# Patient Record
Sex: Female | Born: 1980 | Race: White | Hispanic: No | Marital: Married | Smoking: Never smoker
Health system: Southern US, Community
[De-identification: ages and names within clinical notes are randomized; demographics above are authoritative.]

## PROBLEM LIST (undated history)

## (undated) DIAGNOSIS — J45909 Unspecified asthma, uncomplicated: Secondary | ICD-10-CM

## (undated) DIAGNOSIS — E079 Disorder of thyroid, unspecified: Secondary | ICD-10-CM

## (undated) DIAGNOSIS — M357 Hypermobility syndrome: Secondary | ICD-10-CM

## (undated) DIAGNOSIS — F419 Anxiety disorder, unspecified: Secondary | ICD-10-CM

## (undated) DIAGNOSIS — Q796 Ehlers-Danlos syndrome, unspecified: Secondary | ICD-10-CM

## (undated) DIAGNOSIS — F32A Depression, unspecified: Secondary | ICD-10-CM

## (undated) DIAGNOSIS — N809 Endometriosis, unspecified: Secondary | ICD-10-CM

## (undated) DIAGNOSIS — K219 Gastro-esophageal reflux disease without esophagitis: Secondary | ICD-10-CM

## (undated) DIAGNOSIS — T7840XA Allergy, unspecified, initial encounter: Secondary | ICD-10-CM

## (undated) DIAGNOSIS — K589 Irritable bowel syndrome without diarrhea: Secondary | ICD-10-CM

## (undated) DIAGNOSIS — M797 Fibromyalgia: Secondary | ICD-10-CM

## (undated) HISTORY — DX: Hypermobility syndrome: M35.7

## (undated) HISTORY — DX: Gastro-esophageal reflux disease without esophagitis: K21.9

## (undated) HISTORY — DX: Ehlers-Danlos syndrome, unspecified: Q79.60

## (undated) HISTORY — DX: Disorder of thyroid, unspecified: E07.9

## (undated) HISTORY — DX: Irritable bowel syndrome, unspecified: K58.9

## (undated) HISTORY — DX: Allergy, unspecified, initial encounter: T78.40XA

## (undated) HISTORY — DX: Unspecified asthma, uncomplicated: J45.909

## (undated) HISTORY — DX: Anxiety disorder, unspecified: F41.9

## (undated) HISTORY — DX: Depression, unspecified: F32.A

---

## 2017-07-26 DIAGNOSIS — M797 Fibromyalgia: Secondary | ICD-10-CM | POA: Insufficient documentation

## 2017-07-27 DIAGNOSIS — E039 Hypothyroidism, unspecified: Secondary | ICD-10-CM | POA: Insufficient documentation

## 2017-07-27 DIAGNOSIS — D229 Melanocytic nevi, unspecified: Secondary | ICD-10-CM | POA: Insufficient documentation

## 2017-07-27 DIAGNOSIS — F419 Anxiety disorder, unspecified: Secondary | ICD-10-CM

## 2017-07-27 DIAGNOSIS — F329 Major depressive disorder, single episode, unspecified: Secondary | ICD-10-CM | POA: Insufficient documentation

## 2017-07-27 DIAGNOSIS — K589 Irritable bowel syndrome without diarrhea: Secondary | ICD-10-CM | POA: Insufficient documentation

## 2017-07-27 DIAGNOSIS — E038 Other specified hypothyroidism: Secondary | ICD-10-CM | POA: Insufficient documentation

## 2017-07-27 DIAGNOSIS — R000 Tachycardia, unspecified: Secondary | ICD-10-CM | POA: Insufficient documentation

## 2017-07-27 DIAGNOSIS — F32A Depression, unspecified: Secondary | ICD-10-CM | POA: Insufficient documentation

## 2017-08-16 ENCOUNTER — Other Ambulatory Visit: Payer: Self-pay | Admitting: Obstetrics and Gynecology

## 2017-08-16 DIAGNOSIS — R1032 Left lower quadrant pain: Secondary | ICD-10-CM

## 2017-08-23 ENCOUNTER — Other Ambulatory Visit: Payer: Self-pay

## 2017-08-24 ENCOUNTER — Ambulatory Visit
Admission: RE | Admit: 2017-08-24 | Discharge: 2017-08-24 | Disposition: A | Discharge status: Home / Self Care | Payer: BLUE CROSS/BLUE SHIELD | Source: Ambulatory Visit | Attending: Obstetrics and Gynecology | Admitting: Obstetrics and Gynecology

## 2017-08-24 DIAGNOSIS — R1032 Left lower quadrant pain: Secondary | ICD-10-CM

## 2017-09-05 ENCOUNTER — Other Ambulatory Visit: Payer: Self-pay | Admitting: Gastroenterology

## 2017-09-05 DIAGNOSIS — R1032 Left lower quadrant pain: Secondary | ICD-10-CM

## 2017-09-11 ENCOUNTER — Other Ambulatory Visit: Payer: BLUE CROSS/BLUE SHIELD

## 2017-10-04 ENCOUNTER — Emergency Department (HOSPITAL_COMMUNITY)
Admission: EM | Admit: 2017-10-04 | Discharge: 2017-10-04 | Disposition: A | Discharge status: Home / Self Care | Payer: BLUE CROSS/BLUE SHIELD | Attending: Emergency Medicine | Admitting: Emergency Medicine

## 2017-10-04 ENCOUNTER — Emergency Department (HOSPITAL_COMMUNITY): Payer: BLUE CROSS/BLUE SHIELD

## 2017-10-04 ENCOUNTER — Other Ambulatory Visit: Payer: Self-pay

## 2017-10-04 ENCOUNTER — Encounter (HOSPITAL_COMMUNITY): Payer: Self-pay | Admitting: Emergency Medicine

## 2017-10-04 DIAGNOSIS — Z79899 Other long term (current) drug therapy: Secondary | ICD-10-CM | POA: Diagnosis not present

## 2017-10-04 DIAGNOSIS — R002 Palpitations: Secondary | ICD-10-CM | POA: Insufficient documentation

## 2017-10-04 DIAGNOSIS — R008 Other abnormalities of heart beat: Secondary | ICD-10-CM | POA: Diagnosis present

## 2017-10-04 HISTORY — DX: Fibromyalgia: M79.7

## 2017-10-04 HISTORY — DX: Endometriosis, unspecified: N80.9

## 2017-10-04 LAB — CBC
HCT: 43 % (ref 36.0–46.0)
Hemoglobin: 14.2 g/dL (ref 12.0–15.0)
MCH: 30.8 pg (ref 26.0–34.0)
MCHC: 33 g/dL (ref 30.0–36.0)
MCV: 93.3 fL (ref 78.0–100.0)
Platelets: 270 10*3/uL (ref 150–400)
RBC: 4.61 MIL/uL (ref 3.87–5.11)
RDW: 12.6 % (ref 11.5–15.5)
WBC: 8.4 10*3/uL (ref 4.0–10.5)

## 2017-10-04 LAB — BASIC METABOLIC PANEL
Anion gap: 10 (ref 5–15)
BUN: 7 mg/dL (ref 6–20)
CHLORIDE: 106 mmol/L (ref 101–111)
CO2: 24 mmol/L (ref 22–32)
Calcium: 9.6 mg/dL (ref 8.9–10.3)
Creatinine, Ser: 0.79 mg/dL (ref 0.44–1.00)
GFR calc Af Amer: >60 mL/min (ref >60)
GFR calc non Af Amer: >60 mL/min (ref >60)
Glucose, Bld: 116 mg/dL — ABNORMAL HIGH (ref 65–99)
POTASSIUM: 3.7 mmol/L (ref 3.5–5.1)
SODIUM: 140 mmol/L (ref 135–145)

## 2017-10-04 LAB — I-STAT TROPONIN, ED: Troponin i, poc: 0 ng/mL (ref 0.00–0.08)

## 2017-10-04 LAB — I-STAT BETA HCG BLOOD, ED (MC, WL, AP ONLY)

## 2017-10-04 MED ORDER — SODIUM CHLORIDE 0.9 % IV BOLUS
1000.0000 mL | Freq: Once | INTRAVENOUS | Status: AC
  Administered 2017-10-04: 1000 mL via INTRAVENOUS

## 2017-10-04 NOTE — ED Provider Notes (Signed)
[facility] DEPT Provider Note   CSN: [id number] Arrival date & time: 10/04/17  1551     History   Chief Complaint Chief Complaint  Patient presents with  . Irregular Heart Beat    HPI Sharon Taylor is a 37 y.o. female.  HPI Patient presents with palpitations.  States that over the last month she has had tingling nausea lightheadedness.  She been seen by her primary care doctor for it.  She got an apple watch to monitor for atrial fibrillation and she states just to reassure her.  Has been worked up for several different autoimmune diseases.  Reportedly has fibromyalgia now.Patient had an episode today where she felt the same symptoms and her apple watch said atrial fibrillation.  She has had several different episodes where it will say inconclusive but this the first time to set atrial fibrillation.  She states she does not feel her heart going faster slow during the event. Past Medical History:  Diagnosis Date  . Endometriosis   . Fibromyalgia     There are no active problems to display for this patient.   History reviewed. No pertinent surgical history.   OB History   None      Home Medications    Prior to Admission medications   Medication Sig Start Date End Date Taking? Authorizing Provider  carboxymethylcellul-glycerin (OPTIVE) 0.5-0.9 % ophthalmic solution Place 1 drop into both eyes 2 (two) times daily.   Yes [provider]  cetirizine (ZYRTEC) 10 MG tablet Take 10 mg by mouth daily.   Yes [provider]  cholecalciferol (VITAMIN D) 1000 units tablet Take 2,000 Units by mouth daily.   Yes [provider]  NUVARING 0.12-0.015 MG/24HR vaginal ring Place 1 each vaginally every 28 (twenty-eight) days.  09/24/17  Yes [provider]    Family History No family history on file.  Social History Social History   Tobacco Use  . Smoking status: Not on file  Substance Use Topics  . Alcohol  use: Not on file  . Drug use: Not on file     Allergies   Patient has no known allergies.   Review of Systems Review of Systems  Constitutional: Negative for appetite change.  HENT: Negative for congestion.   Respiratory: Negative for shortness of breath.   Cardiovascular: Positive for palpitations.  Gastrointestinal: Negative for abdominal pain.  Genitourinary: Negative for flank pain.  Musculoskeletal: Positive for myalgias. Negative for back pain.  Skin: Negative for pallor.  Neurological: Positive for light-headedness.  Hematological: Positive for adenopathy.  Psychiatric/Behavioral: Negative for confusion.     Physical Exam Updated Vital Signs BP (!) 90/59 (BP Location: Right Arm)   Pulse (!) 59   Temp 98 F (36.7 C) (Oral)   Resp 16   SpO2 100%   Physical Exam  Constitutional: She appears well-developed.  HENT:  Head: Atraumatic.  Eyes: EOM are normal.  Neck: Neck supple.  Cardiovascular: Normal rate.  Pulmonary/Chest: Effort normal.  Abdominal: Soft. There is no tenderness.  Musculoskeletal: She exhibits no edema.  Neurological: She is alert.  Skin: Skin is warm. Capillary refill takes less than 2 seconds.     ED Treatments / Results  Labs (all labs ordered are listed, but only abnormal results are displayed) Labs Reviewed  BASIC METABOLIC PANEL - Abnormal; Notable for the following components:      Result Value   Glucose, Bld 116 (*)    All other components within normal limits  CBC  I-STAT TROPONIN, ED  I-STAT BETA HCG BLOOD, ED ([facility], [facility], [facility] ONLY)    EKG EKG Interpretation  Date/Time:  Wednesday Oct 04 2017 15:57:28 EDT Ventricular Rate:  82 PR Interval:    QRS Duration: 78 QT Interval:  334 QTC Calculation: 390 R Axis:   96 Text Interpretation:  Sinus rhythm Atrial premature complex Short PR interval Right atrial enlargement Borderline right axis deviation Repol abnrm suggests ischemia, inferior leads Confirmed by Davonna Belling  [phone]) on 10/04/2017 5:09:07 PM   Radiology Dg Chest 2 View  Result Date: 10/04/2017 CLINICAL DATA:  Chest pain, nausea, and lightheadedness. EXAM: CHEST - 2 VIEW COMPARISON:  None. FINDINGS: The heart size and mediastinal contours are within normal limits. Both lungs are clear. The visualized skeletal structures are unremarkable. IMPRESSION: No active cardiopulmonary disease. Electronically Signed   By: Titus Dubin M.D.   On: 10/04/2017 16:54    Procedures Procedures (including critical care time)  Medications Ordered in ED Medications  sodium chloride 0.9 % bolus 1,000 mL (1,000 mLs Intravenous New Bag/Given 10/04/17 1630)     Initial Impression / Assessment and Plan / ED Course  I have reviewed the triage vital signs and the nursing notes.  Pertinent labs & imaging results that were available during my care of the patient were reviewed by me and considered in my medical decision making (see chart for details).     Patient with episodes of palpitations and feeling her heartbeat differently.  Sinus rhythm here.  There are some nonspecific ST changes however on EKG.  Lab work reassuring.  Glucose slightly elevated.  Given cardiology for follow-up and may need a longer monitor.  While in triage getting her blood drawn she had what appears to be a vagal episode and her blood pressure drop.  Is since returned and she feels much better.  Discharge home.  Final Clinical Impressions(s) / ED Diagnoses   Final diagnoses:  Palpitations    ED Discharge Orders    None       Davonna Belling, MD 10/04/17 1739

## 2017-10-04 NOTE — ED Triage Notes (Addendum)
Pt complaint of extremity tingling, nausea, hot flashes, and lightheadedness worse over past week but present for a few months; "watch said I had atrial fibrillation."

## 2017-10-04 NOTE — ED Notes (Signed)
Unsuccessful IV attempt to right hand.

## 2017-10-12 ENCOUNTER — Ambulatory Visit: Payer: BLUE CROSS/BLUE SHIELD | Admitting: Cardiology

## 2017-10-12 ENCOUNTER — Encounter: Payer: Self-pay | Admitting: Cardiology

## 2017-10-12 VITALS — BP 118/70 | HR 68 | Ht 68.0 in | Wt 140.8 lb

## 2017-10-12 DIAGNOSIS — I499 Cardiac arrhythmia, unspecified: Secondary | ICD-10-CM | POA: Diagnosis not present

## 2017-10-12 NOTE — Progress Notes (Signed)
Cardiology Office Note   Date:  10/12/2017   ID:  Sharon Taylor, DOB [birth year], MRN [id number]  PCP:  Katherina Mires, MD  Cardiologist:   No primary care provider on file. Referring:  ED  Chief Complaint  Patient presents with  . Palpitations      History of Present Illness: Sharon Taylor is a 37 y.o. female who presents for evaluation of palpitations.  The patient was in the emergency room the other day.  I reviewed these records.  She has palpitations and she Apple wat there were many episodes when she was having symptomsch.  But the  strips could not be interpreted because of artifact.  One episode could not exclude atrial fibrillation.  She presented to the emergency room and they did review the strips but again there was significant artifact and significant arrhythmia could not be excluded.  I did review these records for this appointment.  She had PACs she says that she feels on an EKG. sporadic palpitations may be a few times a week.  It happens when she gets overheated or may be with caffeine.  She will feel skipping beats and describes it as her heart pounding.  It is not necessarily rapid beats.  She gets lightheaded with it.  She gets very anxious.  She has to calm down and it might last for about an hour.  She has not had any frank syncope.  She denies any chest pressure, neck or arm discomfort.  She is otherwise not had any cardiac history or work-up.  She is not active particularly because of some joint problems and possible fibromyalgia.   Past Medical History:  Diagnosis Date  . Benign joint hypermobility   . Endometriosis   . Fibromyalgia     History reviewed. No pertinent surgical history.   Current Outpatient Medications  Medication Sig Dispense Refill  . carboxymethylcellul-glycerin (OPTIVE) 0.5-0.9 % ophthalmic solution Place 1 drop into both eyes 2 (two) times daily.    . cetirizine (ZYRTEC) 10 MG tablet Take 10 mg by mouth daily.    .  cholecalciferol (VITAMIN D) 1000 units tablet Take 2,000 Units by mouth daily.    Marland Kitchen ibuprofen (ADVIL,MOTRIN) 200 MG tablet Take 200-400 mg by mouth every 6 (six) hours as needed.    . NON FORMULARY Take 5-10 mg by mouth as needed (for anxiety). CBD Oil ([name])    . NUVARING 0.12-0.015 MG/24HR vaginal ring Place 1 each vaginally every 28 (twenty-eight) days.   1   No current facility-administered medications for this visit.     Allergies:   Patient has no known allergies.    Social History:  The patient  reports that she has never smoked. She has never used smokeless tobacco. She reports that she drank alcohol. She reports that she does not use drugs.   Family History:  The patient's family history includes COPD in her mother; Heart attack in her maternal grandfather; Heart attack (age of onset: 71) in her father; Heart disease in her maternal grandfather; Hypertension in her paternal grandfather.    ROS:  Please see the history of present illness.   Otherwise, review of systems are positive for none.   All other systems are reviewed and negative.    PHYSICAL EXAM: VS:  BP 118/70 Comment: right  Pulse 68   Ht 5\' 8"  (1.727 m)   Wt 140 lb 12.8 oz (63.9 kg)   BMI 21.41 kg/m  , BMI Body mass index is  21.41 kg/m. GENERAL:  Well appearing HEENT:  Pupils equal round and reactive, fundi not visualized, oral mucosa unremarkable NECK:  No jugular venous distention, waveform within normal limits, carotid upstroke brisk and symmetric, no bruits, no thyromegaly LYMPHATICS:  No cervical, inguinal adenopathy LUNGS:  Clear to auscultation bilaterally BACK:  No CVA tenderness CHEST:  Unremarkable HEART:  PMI not displaced or sustained,S1 and S2 within normal limits, no S3, no S4, no clicks, no rubs, no murmurs ABD:  Flat, positive bowel sounds normal in frequency in pitch, no bruits, no rebound, no guarding, no midline pulsatile mass, no hepatomegaly, no splenomegaly EXT:  2 plus pulses  throughout, no edema, no cyanosis no clubbing SKIN:  No rashes no nodules NEURO:  Cranial nerves II through XII grossly intact, motor grossly intact throughout PSYCH:  Cognitively intact, oriented to person place and time    EKG:  EKG is ordered today. The ekg ordered today demonstrates sinus rhythm, rate 68, axis within normal limits, short PR interval, no acute ST-T wave changes.   Recent Labs: 10/04/2017: BUN 7; Creatinine, Ser 0.79; Hemoglobin 14.2; Platelets 270; Potassium 3.7; Sodium 140    Lipid Panel No results found for: CHOL, TRIG, HDL, CHOLHDL, VLDL, LDLCALC, LDLDIRECT    Wt Readings from Last 3 Encounters:  10/12/17 140 lb 12.8 oz (63.9 kg)      Other studies Reviewed: Additional studies/ records that were reviewed today include: ED records. Review of the above records demonstrates:  Please see elsewhere in the note.     ASSESSMENT AND PLAN:  PALPITATIONS: She does have PACs that were documented.  I do not suspect any other significant dysrhythmia.  She did not have any wide-complex rhythm on any of her strips.  However, all of the apple strips that I reviewed had too much artifact to determine the presence or absence of any significant atrial arrhythmia.  We talked at length about this.  She would be low risk for thromboembolic events if she did have atrial fibrillation which would be unlikely.  This does not appear to be another type of SVT but I could not exclude atrial fibrillation versus premature atrial contractions on her apple watch.  We discussed the possibilities of a 21-day event monitor versus an Alive Cor.  Based on her exam I do not suspect any structural heart disease.  She is going to cut out caffeine and see if this improves her symptoms I will consider what other kind of monitoring she wants.  We also discussed management of anxiety to include initiation of routine exercise as her joints will allow.   Current medicines are reviewed at length with the  patient today.  The patient does not have concerns regarding medicines.  The following changes have been made:  no change  Labs/ tests ordered today include: None  Orders Placed This Encounter  Procedures  . EKG 12-Lead     Disposition:   FU with me as needed.     Signed, Minus Breeding, MD  10/12/2017 12:22 PM    [facility]

## 2017-10-12 NOTE — Patient Instructions (Signed)
Medication Instructions:  Continue current medications  If you need a refill on your cardiac medications before your next appointment, please call your pharmacy.  Labwork: None Ordered   Testing/Procedures: None Ordered  Special Instructions: ALIVECOR  Follow-Up: Your physician wants you to follow-up in: As Needed.      Thank you for choosing [facility]!!       

## 2017-10-18 ENCOUNTER — Ambulatory Visit: Payer: BLUE CROSS/BLUE SHIELD | Admitting: Cardiology

## 2017-10-26 ENCOUNTER — Ambulatory Visit
Admission: RE | Admit: 2017-10-26 | Discharge: 2017-10-26 | Disposition: A | Discharge status: Home / Self Care | Payer: BLUE CROSS/BLUE SHIELD | Source: Ambulatory Visit | Attending: Gastroenterology | Admitting: Gastroenterology

## 2017-10-26 DIAGNOSIS — R1032 Left lower quadrant pain: Secondary | ICD-10-CM

## 2017-10-26 MED ORDER — IOPAMIDOL (ISOVUE-300) INJECTION 61%
100.0000 mL | Freq: Once | INTRAVENOUS | Status: AC | PRN
  Administered 2017-10-26: 100 mL via INTRAVENOUS

## 2018-04-10 ENCOUNTER — Telehealth: Payer: Self-pay | Admitting: Cardiology

## 2018-04-10 DIAGNOSIS — R002 Palpitations: Secondary | ICD-10-CM

## 2018-04-10 NOTE — Telephone Encounter (Signed)
New message   Patient would like echocardiogram ordered. Please advise.

## 2018-04-10 NOTE — Telephone Encounter (Signed)
Pt called to report that she has been experiencing increased palpitations over the past 2 weeks.. She says mostly when she gets up from sitting she notices it more.. She says that she does have some light-headednes with it.. She also says that is happens randomly throughout the day and lasts for a few hours.. She is asking to have an ECHO... She says that she talked with Dr. Percival Spanish about it at her last visit 09/2017... She would like to know if she can have one ordered or if she needs to make a follow up again first. Will forward to Dr. Percival Spanish and his nurse.

## 2018-04-10 NOTE — Telephone Encounter (Signed)
Left message to call back  

## 2018-04-11 NOTE — Telephone Encounter (Signed)
OK to order an echocardiogram.

## 2018-04-11 NOTE — Telephone Encounter (Signed)
Echo ordered and send to scheduler to be schedule 

## 2018-04-19 ENCOUNTER — Other Ambulatory Visit (HOSPITAL_COMMUNITY): Payer: BLUE CROSS/BLUE SHIELD

## 2018-04-23 ENCOUNTER — Telehealth: Payer: Self-pay | Admitting: Cardiology

## 2018-04-23 NOTE — Progress Notes (Signed)
Cardiology Office Note   Date:  04/24/2018   ID:  Sharon Taylor, DOB [birth year], MRN [id number]  PCP:  Sharon Mires, MD  Cardiologist:   No primary care provider on file. Referring:  ED  Chief Complaint  Patient presents with  . Palpitations      History of Present Illness: Sharon Taylor is a 37 y.o. female who presents for evaluation of palpitations.  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  She called the other day and she had more palpitations.  Since I last saw her she has been diagnosed with fibromyalgia and perhaps irritable bowel.  She shows me a lot of readings today and there is a significant amount of artifact from her Alive core but what is clearly identifiable is sinus rhythm with sinus arrhythmia.  She feels symptoms as were described before.  She feels palpitations and lightheaded.  She has abdominal discomfort and "feeling gross".  She has hypermobile joints and this is being evaluated as well.  Past Medical History:  Diagnosis Date  . Benign joint hypermobility   . Endometriosis   . Fibromyalgia     History reviewed. No pertinent surgical history.   Current Outpatient Medications  Medication Sig Dispense Refill  . carboxymethylcellul-glycerin (OPTIVE) 0.5-0.9 % ophthalmic solution Place 1 drop into both eyes 2 (two) times daily.    . cetirizine (ZYRTEC) 10 MG tablet Take 10 mg by mouth daily.    . cholecalciferol (VITAMIN D) 1000 units tablet Take 2,000 Units by mouth daily.    Marland Kitchen ibuprofen (ADVIL,MOTRIN) 200 MG tablet Take 200-400 mg by mouth every 6 (six) hours as needed.    . Methylcobalamin (B12-ACTIVE PO) Take 2,500 mcg by mouth 2 (two) times a week.    . NON FORMULARY Take 5-10 mg by mouth as needed (for anxiety). CBD Oil ([name])    . NUVARING 0.12-0.015 MG/24HR vaginal ring Place 1 each vaginally every 28 (twenty-eight) days.   1   No current facility-administered medications for this visit.     Allergies:    Patient has no known allergies.    ROS:  Please see the history of present illness.   Otherwise, review of systems are positive for none.   All other systems are reviewed and negative.    PHYSICAL EXAM: VS:  BP (!) 82/58   Pulse 76   Ht 5\' 8"  (1.727 m)   Wt 138 lb 8 oz (62.8 kg)   SpO2 98%   BMI 21.06 kg/m  , BMI Body mass index is 21.06 kg/m. GENERAL:  Well appearing NECK:  No jugular venous distention, waveform within normal limits, carotid upstroke brisk and symmetric, no bruits, no thyromegaly LUNGS:  Clear to auscultation bilaterally CHEST:  Unremarkable HEART:  PMI not displaced or sustained,S1 and S2 within normal limits, no S3, no S4, no clicks, no rubs, no murmurs ABD:  Flat, positive bowel sounds normal in frequency in pitch, no bruits, no rebound, no guarding, no midline pulsatile mass, no hepatomegaly, no splenomegaly EXT:  2 plus pulses throughout, no edema, no cyanosis no clubbing    EKG:  EKG is  ordered today. The ekg ordered today demonstrates sinus rhythm, rate 82, rightward within normal limits, short PR interval, no acute ST-T wave changes.     Recent Labs: 10/04/2017: BUN 7; Creatinine, Ser 0.79; Hemoglobin 14.2; Platelets 270; Potassium 3.7; Sodium 140    Lipid Panel No results found for: CHOL, TRIG, HDL, CHOLHDL, VLDL,  [place], LDLDIRECT    Wt Readings from Last 3 Encounters:  04/24/18 138 lb 8 oz (62.8 kg)  10/12/17 140 lb 12.8 oz (63.9 kg)      Other studies Reviewed: Additional studies/ records that were reviewed today include: Rhythm strips on her Alive Cor Review of the above records demonstrates:      ASSESSMENT AND PLAN:  PALPITATIONS:    I do not see any significant dysrhythmia.  She was not orthostatic in the office today.  I am going to check an echocardiogram.  Her blood pressure does run low in this will be addressed as below.     HYPOTENSION:   This to be addressed as above.   Current medicines are reviewed at length with the  patient today.  The patient does not have concerns regarding medicines.  The following changes have been made:  None  Labs/ tests ordered today include:    Orders Placed This Encounter  Procedures  . EKG 12-Lead  . ECHOCARDIOGRAM COMPLETE     Disposition:   FU with me after the echo.   Signed, Minus Breeding, MD  04/24/2018 3:49 PM    [facility]

## 2018-04-23 NOTE — Telephone Encounter (Signed)
New message   Patient's wife was advised tha EKG information could be sent to the office. Please advise on how this can be done?

## 2018-04-23 NOTE — Telephone Encounter (Signed)
Spoke to  PATIENT - she states  She had some aekg readings  That were not conclusive with readings- patient felt different and wanted to know how to send report to dr hochrein  RN informed patient she has not activated  mychart . She needs to be done,prior to sending a PDF of EKG strips to DR Hochrein,  Activation code given and instructions patient verbalized understading - appointment schedule for tomorrow - patient will call if she is unable to get  Referral from primary doctor for visit . She will call office by 11 am,if she has to cancel  appointment

## 2018-04-24 ENCOUNTER — Ambulatory Visit: Payer: 59 | Admitting: Cardiology

## 2018-04-24 ENCOUNTER — Encounter: Payer: Self-pay | Admitting: Cardiology

## 2018-04-24 VITALS — BP 82/58 | HR 76 | Ht 68.0 in | Wt 138.5 lb

## 2018-04-24 DIAGNOSIS — R002 Palpitations: Secondary | ICD-10-CM | POA: Diagnosis not present

## 2018-04-24 DIAGNOSIS — I959 Hypotension, unspecified: Secondary | ICD-10-CM | POA: Diagnosis not present

## 2018-04-24 NOTE — Patient Instructions (Signed)
Medication Instructions:  Continue current medications  If you need a refill on your cardiac medications before your next appointment, please call your pharmacy.  Labwork: None Ordered   If you have labs (blood work) drawn today and your tests are completely normal, you will receive your results only by: Marland Kitchen MyChart Message (if you have MyChart) OR . A paper copy in the mail If you have any lab test that is abnormal or we need to change your treatment, we will call you to review the results.  Testing/Procedures: Your physician has requested that you have an echocardiogram. Echocardiography is a painless test that uses sound waves to create images of your heart. It provides your doctor with information about the size and shape of your heart and how well your heart's chambers and valves are working. This procedure takes approximately one hour. There are no restrictions for this procedure.  Follow-Up: . You will need a follow up appointment in After Echo.    At [facility], you and your health needs are our priority.  As part of our continuing mission to provide you with exceptional heart care, we have created designated Provider Care Teams.  These Care Teams include your primary Cardiologist (physician) and Advanced Practice Providers (APPs -  Physician Assistants and Nurse Practitioners) who all work together to provide you with the care you need, when you need it.

## 2018-05-03 ENCOUNTER — Other Ambulatory Visit (HOSPITAL_COMMUNITY): Payer: 59

## 2018-05-04 ENCOUNTER — Other Ambulatory Visit: Payer: Self-pay

## 2018-05-04 ENCOUNTER — Ambulatory Visit (HOSPITAL_COMMUNITY): Payer: 59 | Attending: Internal Medicine

## 2018-05-04 DIAGNOSIS — R002 Palpitations: Secondary | ICD-10-CM | POA: Diagnosis not present

## 2018-05-14 NOTE — Progress Notes (Signed)
Cardiology Office Note   Date:  05/15/2018   ID:  Sharon Taylor, DOB Nov 03, 1980, MRN [id number]  PCP:  Sharon Mires, MD  Cardiologist: Sharon Taylor  Chief Complaint  Patient presents with  . Palpitations     History of Present Illness: Sharon Taylor is a 37 y.o. female who presents for ongoing assessment and management of palpitations. This was also recorded on her Apple Watch. Saw Dr. Percival Taylor on 04/24/2018 who also noted that she has recently been diagnosed with fibromyalgia, IBS and has hypermobile joints. An echocardiogram was ordered. She hypotensive on evaluation but not noted to be orthostatic.   Echo completed on 05/04/2018 revealed Normal biventricular function, LVEF 55-60%. Normal biatrial chamber size. No hemodynamically significant valvular heart disease.  She is here today and has continued complaints of palpitations and feelings of weakness. She is accompanied by her wife who confirms that she is symptomatic and often looks flushed and does get dizzy. She has increased her salt intake and has increased her fluids but often has episodes 2-4 times a week. Using with standing.   Past Medical History:  Diagnosis Date  . Benign joint hypermobility   . Endometriosis   . Fibromyalgia     History reviewed. No pertinent surgical history.   Current Outpatient Medications  Medication Sig Dispense Refill  . carboxymethylcellul-glycerin (OPTIVE) 0.5-0.9 % ophthalmic solution Place 1 drop into both eyes 2 (two) times daily.    . cetirizine (ZYRTEC) 10 MG tablet Take 10 mg by mouth daily.    . cholecalciferol (VITAMIN D) 1000 units tablet Take 2,000 Units by mouth daily.    Marland Kitchen ibuprofen (ADVIL,MOTRIN) 200 MG tablet Take 200-400 mg by mouth every 6 (six) hours as needed.    . Methylcobalamin (B12-ACTIVE PO) Take 2,500 mcg by mouth 2 (two) times a week.    . NON FORMULARY Take 5-10 mg by mouth as needed (for anxiety). CBD Oil ([name])    . NUVARING 0.12-0.015  MG/24HR vaginal ring Place 1 each vaginally every 28 (twenty-eight) days.   1   No current facility-administered medications for this visit.     Allergies:   Patient has no known allergies.    Social History:  The patient  reports that she has never smoked. She has never used smokeless tobacco. She reports previous alcohol use. She reports that she does not use drugs.   Family History:  The patient's family history includes COPD in her mother; Heart attack in her maternal grandfather; Heart attack (age of onset: 45) in her father; Heart disease in her maternal grandfather; Hypertension in her paternal grandfather.    ROS: All other systems are reviewed and negative. Unless otherwise mentioned in H&P    PHYSICAL EXAM: VS:  BP 96/68   Pulse 85   Ht 5\' 8"  (1.727 m)   Wt 141 lb (64 kg)   BMI 21.44 kg/m  , BMI Body mass index is 21.44 kg/m. GEN: Well nourished, well developed, in no acute distress HEENT: normal Neck: no JVD, carotid bruits, or masses Cardiac: RRR; no murmurs, rubs, or gallops,no edema  Respiratory:  Clear to auscultation bilaterally, normal work of breathing GI: soft, nontender, nondistended, + BS MS: no deformity or atrophy Skin: warm and dry, no rash Neuro:  Strength and sensation are intact Psych: euthymic mood, full affect   EKG: Not on this office visit.   Recent Labs: 10/04/2017: BUN 7; Creatinine, Ser 0.79; Hemoglobin 14.2; Platelets 270; Potassium 3.7; Sodium 140    Lipid Panel  No results found for: CHOL, TRIG, HDL, CHOLHDL, VLDL, LDLCALC, LDLDIRECT    Wt Readings from Last 3 Encounters:  05/15/18 141 lb (64 kg)  04/24/18 138 lb 8 oz (62.8 kg)  10/12/17 140 lb 12.8 oz (63.9 kg)      Other studies Reviewed: Echocardiogram 05-12-18 Left ventricle: The cavity size was normal. Wall thickness was   normal. Systolic function was normal. The estimated ejection   fraction was in the range of 55% to 60%. Although no diagnostic   regional wall  motion abnormality was identified, this possibility   cannot be completely excluded on the basis of this study. Left   ventricular diastolic function parameters were normal. - Aortic valve: Transvalvular velocity was within the normal range.   There was no stenosis. There was no regurgitation. - Mitral valve: Transvalvular velocity was within the normal range.   There was no evidence for stenosis. There was no regurgitation. - Left atrium: The atrium was normal in size. - Right ventricle: The cavity size was normal. Wall thickness was   normal. Systolic function was normal. RV systolic pressure (S,   est): 17 mm Hg. - Right atrium: The atrium was normal in size. Central venous   pressure (est): 3 mm Hg. - Tricuspid valve: There was trivial regurgitation. - Pulmonary arteries: Systolic pressure was within the normal   range. - Inferior vena cava: The vessel was normal in size. The   respirophasic diameter changes were in the normal range (>= 50%),   consistent with normal central venous pressure. - Pericardium, extracardiac: There was no pericardial effusion.  Impressions:  - Normal biventricular function, LVEF 55-60%. Normal biatrial   chamber size. No hemodynamically significant valvular heart   disease.   ASSESSMENT AND PLAN:  1. Frequent palpitations:  She continues to have symptoms despite having increased fluid intake and salt, with mild dizziness associated. She becomes anxious when she is symptomatic. I will place a 2 week ZIO cardiac monitor to assess morphology and frequency of palpitations.   2. Hypotension: She is to be careful moving from sitting or lying position to standing position to avoid dizziness. She was not orthostatic on prior office visit.   Current medicines are reviewed at length with the patient today.    Labs/ tests ordered today include: 2 week cardiac monitor.   Phill Myron. West Pugh, ANP, AACC   05/15/2018 9:02 AM    [facility] [place] [address] Office [phone] Fax [phone]

## 2018-05-15 ENCOUNTER — Encounter: Payer: Self-pay | Admitting: Adult Health

## 2018-05-15 ENCOUNTER — Ambulatory Visit: Payer: 59 | Admitting: Adult Health

## 2018-05-15 VITALS — BP 96/68 | HR 85 | Ht 68.0 in | Wt 141.0 lb

## 2018-05-15 DIAGNOSIS — R002 Palpitations: Secondary | ICD-10-CM

## 2018-05-15 DIAGNOSIS — I950 Idiopathic hypotension: Secondary | ICD-10-CM | POA: Diagnosis not present

## 2018-05-15 DIAGNOSIS — R42 Dizziness and giddiness: Secondary | ICD-10-CM

## 2018-05-15 DIAGNOSIS — S93409A Sprain of unspecified ligament of unspecified ankle, initial encounter: Secondary | ICD-10-CM | POA: Insufficient documentation

## 2018-05-15 NOTE — Patient Instructions (Addendum)
Medication Instructions:  NO CHANGES- Your physician recommends that you continue on your current medications as directed. Please refer to the Current Medication list given to you today. If you need a refill on your cardiac medications before your next appointment, please call your pharmacy.  Labwork: NONE ORDERED If you have labs (blood work) drawn today and your tests are completely normal, you will receive your results only by MyChart Message (if you have MyChart) -OR- A paper copy in the mail.  If you have any lab test that is abnormal or we need to change your treatment, we will call you to review these results.  Testing/Procedures: Your physician has recommended that you wear a 14 DAY ZIO-PATCH monitor. The Zio patch cardiac monitor continuously records heart rhythm data for up to 14 days, this is for patients being evaluated for multiple types heart rhythms. For the first 24 hours post application, please avoid getting the Zio monitor wet in the shower or by excessive sweating during exercise. After that, feel free to carry on with regular activities. Keep soaps and lotions away from the ZIO XT Patch.  This will be placed at our [facility] location - [address], [address].    Special Instructions: PLEASE CALL IF SYMPTOMS PERSIST  Follow-Up: You will need a follow up appointment in 1 months-after monitor.  You may see No primary care provider on file. Jory Sims, DNP, AACC  or one of the following Advanced Practice Providers on your designated Care Team:    Rosaria Ferries, PA-C  At [facility], you and your health needs are our priority.  As part of our continuing mission to provide you with exceptional heart care, we have created designated Provider Care Teams.  These Care Teams include your primary Cardiologist (physician) and Advanced Practice Providers (APPs -  Physician Assistants and Nurse Practitioners) who all work together to provide you with the care you need, when you  need it.

## 2018-05-24 ENCOUNTER — Ambulatory Visit (INDEPENDENT_AMBULATORY_CARE_PROVIDER_SITE_OTHER): Payer: Commercial Managed Care - PPO

## 2018-05-24 DIAGNOSIS — R002 Palpitations: Secondary | ICD-10-CM

## 2018-05-24 DIAGNOSIS — R42 Dizziness and giddiness: Secondary | ICD-10-CM | POA: Diagnosis not present

## 2018-06-23 NOTE — Progress Notes (Signed)
Cardiology Office Note   Date:  06/25/2018   ID:  Sharon Taylor, DOB 12-11-1980, MRN [id number]  PCP:  Katherina Mires, MD  Cardiologist:  [facility]  Chief Complaint  Patient presents with  . Follow-up    Palpitations     History of Present Illness: Sharon Taylor is a 38 y.o. female who presents for  ongoing assessment and management of palpitations. This was also recorded on her Apple Watch. Saw Dr. Percival Spanish on 04/24/2018 who also noted that she has recently been diagnosed with fibromyalgia, IBS and has hypermobile joints. An echocardiogram was ordered  Echo completed on 05/04/2018 revealed Normal biventricular function, LVEF 55-60%. Normal biatrial chamber size. No hemodynamically significant valvular heart disease.  On last visit, a two week Zio monitor was placed to evaluate more closely to identify if she was having any significant arrhythmia.   Results:  Patient had a min HR of 52 bpm, max HR of 165 bpm, and avg HR of 76 bpm. Predominant underlying rhythm was Sinus Rhythm. Isolated SVEs were rare (<1.0%), SVE Couplets were rare (<1.0%), and SVE Triplets were rare (<1.0%). Isolated VEs were rare (<1.0%), and no VE Couplets or VE Triplets were present  She states that when she was wearing the monitor she did feel the palpitations as much as usual. She admits to feeling some anxiousness at times, which contributes of feelings of rapid HR or palpitations that do not always correlate to evidence of elevated HR.  Past Medical History:  Diagnosis Date  . Benign joint hypermobility   . Endometriosis   . Fibromyalgia     No past surgical history on file.   Current Outpatient Medications  Medication Sig Dispense Refill  . carboxymethylcellul-glycerin (OPTIVE) 0.5-0.9 % ophthalmic solution Place 1 drop into both eyes 2 (two) times daily.    . cetirizine (ZYRTEC) 10 MG tablet Take 10 mg by mouth daily.    . cholecalciferol (VITAMIN D) 1000 units tablet Take 2,000  Units by mouth daily.    Marland Kitchen ibuprofen (ADVIL,MOTRIN) 200 MG tablet Take 200-400 mg by mouth every 6 (six) hours as needed.    . Methylcobalamin (B12-ACTIVE PO) Take 2,500 mcg by mouth 2 (two) times a week.    . NON FORMULARY Take 5-10 mg by mouth as needed (for anxiety). CBD Oil ([name])    . NUVARING 0.12-0.015 MG/24HR vaginal ring Place 1 each vaginally every 28 (twenty-eight) days.   1   No current facility-administered medications for this visit.     Allergies:   Patient has no known allergies.    Social History:  The patient  reports that she has never smoked. She has never used smokeless tobacco. She reports previous alcohol use. She reports that she does not use drugs.   Family History:  The patient's family history includes COPD in her mother; Heart attack in her maternal grandfather; Heart attack (age of onset: 51) in her father; Heart disease in her maternal grandfather; Hypertension in her paternal grandfather.    ROS: All other systems are reviewed and negative. Unless otherwise mentioned in H&P    PHYSICAL EXAM: VS:  BP 112/70   Pulse 73   Ht 5\' 9"  (1.753 m)   Wt 144 lb 9.6 oz (65.6 kg)   BMI 21.35 kg/m  , BMI Body mass index is 21.35 kg/m. GEN: Well nourished, well developed, in no acute distress HEENT: normal Neck: no JVD, carotid bruits, or masses Cardiac: RRR; no murmurs, rubs, or gallops,no edema  Respiratory:  Clear to auscultation bilaterally, normal work of breathing GI: soft, nontender, nondistended, + BS MS: no deformity or atrophy Skin: warm and dry, no rash Neuro:  Strength and sensation are intact Psych: euthymic mood, full affect   EKG:  Not completed this office visit.   Recent Labs: 10/04/2017: BUN 7; Creatinine, Ser 0.79; Hemoglobin 14.2; Platelets 270; Potassium 3.7; Sodium 140    Lipid Panel No results found for: CHOL, TRIG, HDL, CHOLHDL, VLDL, LDLCALC, LDLDIRECT    Wt Readings from Last 3 Encounters:  06/25/18 144 lb 9.6 oz  (65.6 kg)  05/15/18 141 lb (64 kg)  04/24/18 138 lb 8 oz (62.8 kg)      Other studies Reviewed: Echocardiogram May 10, 2018 Left ventricle: The cavity size was normal. Wall thickness was normal. Systolic function was normal. The estimated ejection fraction was in the range of 55% to 60%. Although no diagnostic regional wall motion abnormality was identified, this possibility cannot be completely excluded on the basis of this study. Left ventricular diastolic function parameters were normal. - Aortic valve: Transvalvular velocity was within the normal range. There was no stenosis. There was no regurgitation. - Mitral valve: Transvalvular velocity was within the normal range. There was no evidence for stenosis. There was no regurgitation. - Left atrium: The atrium was normal in size. - Right ventricle: The cavity size was normal. Wall thickness was normal. Systolic function was normal. RV systolic pressure (S, est): 17 mm Hg. - Right atrium: The atrium was normal in size. Central venous pressure (est): 3 mm Hg. - Tricuspid valve: There was trivial regurgitation. - Pulmonary arteries: Systolic pressure was within the normal range. - Inferior vena cava: The vessel was normal in size. The respirophasic diameter changes were in the normal range (>= 50%), consistent with normal central venous pressure. - Pericardium, extracardiac: There was no pericardial effusion.  Impressions:  - Normal biventricular function, LVEF 55-60%. Normal biatrial chamber size. No hemodynamically significant valvular heart disease.  ASSESSMENT AND PLAN:  1. Palpitations: I have reviewed her Zio cardiac monitor. She is not having any significant arrhythmia's or pauses. HR on average is in the 70's. She did have one episode of rapid HR in the 90's after she ate breakfast that caused her to feel nauseated  I will not plan on any more testing at this time, "Watchful Waiting" is  discussed. She is given Rx for propanolol 10 mg prn rapid HR that does not subside on its own after 15-20  minutes. She is to use other methods for relaxation as she becomes anxious very easily   2. Chronic Anxiety: She will need to discuss possibility of anti-anxiety medications or counseling with PCP for recommendations.   Current medicines are reviewed at length with the patient today.    Labs/ tests ordered today include: None   Phill Myron. West Pugh, ANP, AACC   06/25/2018 8:41 AM    [facility] [place] [address] Office [phone] Fax [phone]

## 2018-06-25 ENCOUNTER — Ambulatory Visit (INDEPENDENT_AMBULATORY_CARE_PROVIDER_SITE_OTHER): Payer: Commercial Managed Care - PPO | Admitting: Adult Health

## 2018-06-25 ENCOUNTER — Encounter: Payer: Self-pay | Admitting: Adult Health

## 2018-06-25 VITALS — BP 112/70 | HR 73 | Ht 69.0 in | Wt 144.6 lb

## 2018-06-25 DIAGNOSIS — R002 Palpitations: Secondary | ICD-10-CM

## 2018-06-25 DIAGNOSIS — F419 Anxiety disorder, unspecified: Secondary | ICD-10-CM

## 2018-06-25 MED ORDER — PROPRANOLOL HCL 10 MG PO TABS: 10.0000 mg | ORAL_TABLET | Freq: Every day | ORAL | 1 refills | Status: DC | PRN

## 2018-06-25 NOTE — Patient Instructions (Signed)
Follow-Up: You will need a follow up appointment in 6 months.  Please call our office 2 months (June 2020)   in advance to schedule this (AUGUST 2020) appointment.  You may see Minus Breeding, MD Jory Sims, DNP, AACC or one of the following Advanced Practice Providers on your designated Care Team:  Jory Sims, DNP, AACC  Rosaria Ferries, PA-C  Medication Instructions:  MAY TAKE PROPRANOLOL 10MG  AS NEEDED FOR RAPID HEART RATE PLEASE CALL WITH ANY ISSUES. Your physician recommends that you continue on your current medications as directed. Please refer to the Current Medication list given to you today. If you need a refill on your cardiac medications before your next appointment, please call your pharmacy. Labwork: When you have labs (blood work) and your tests are completely normal, you will receive your results ONLY by [place] (if you have MyChart) -OR- A paper copy in the mail.  At [facility], you and your health needs are our priority.  As part of our continuing mission to provide you with exceptional heart care, we have created designated Provider Care Teams.  These Care Teams include your primary Cardiologist (physician) and Advanced Practice Providers (APPs -  Physician Assistants and Nurse Practitioners) who all work together to provide you with the care you need, when you need it.  Thank you for choosing [facility] at [facility]!!

## 2018-06-29 ENCOUNTER — Telehealth: Payer: Self-pay | Admitting: Cardiology

## 2018-06-29 NOTE — Telephone Encounter (Signed)
New Message   PT is returning phone call regarding the heart monitor she wore. Please call

## 2018-06-29 NOTE — Telephone Encounter (Signed)
Spoke with pt about her heart monitor

## 2018-10-30 ENCOUNTER — Telehealth: Payer: Self-pay | Admitting: *Deleted

## 2018-10-30 NOTE — Telephone Encounter (Signed)
A message was left, re: follow up visit. 

## 2019-02-25 ENCOUNTER — Telehealth: Payer: Self-pay | Admitting: *Deleted

## 2019-02-25 NOTE — Telephone Encounter (Signed)
A message was left. Re: her follow up visit. °

## 2019-03-21 ENCOUNTER — Telehealth: Payer: Self-pay | Admitting: *Deleted

## 2019-03-21 NOTE — Telephone Encounter (Signed)
We have left several messages,re: her follow up visit.

## 2019-11-23 IMAGING — CT CT ABD-PELV W/ CM
1 of 2 series · 14 of 32 positions shown, 19 images · IV contrast (APPLIED)
Comparison: None.

CLINICAL DATA: LLQ pain For 2 months and upper abdomen pain for 2-3
weeks Cancer-none Surgery-none Hx of IBS Nausea

EXAM:
CT ABDOMEN AND PELVIS WITH CONTRAST
TECHNIQUE: Multidetector CT imaging of the abdomen and pelvis was performed
using the standard protocol following bolus administration of
intravenous contrast.
CONTRAST:  100mL F6OS80-SPP IOPAMIDOL (F6OS80-SPP) INJECTION 61%

[Series 2: abd/pelvis w/cm · axial · 0.68mm/px · z∈[-439,-14]mm · 14 of 95 slices shown, 19 images]
[im 5/95  soft-tissue]
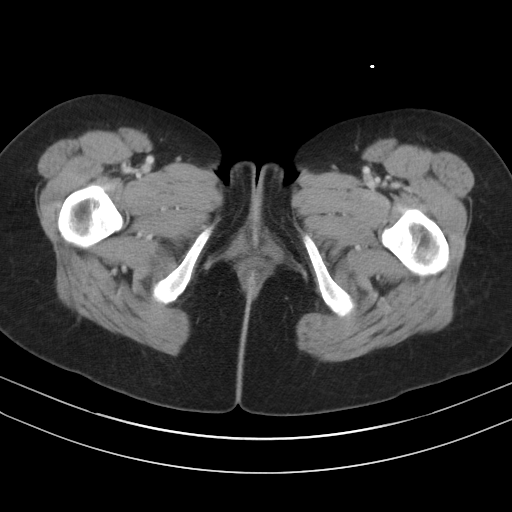
[im 5/95  bone]
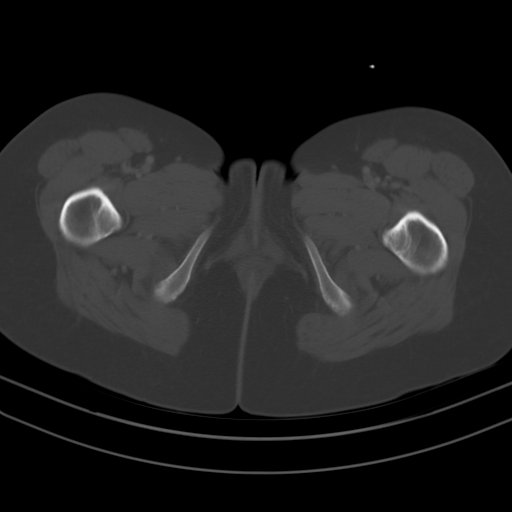
[im 15/95  soft-tissue]
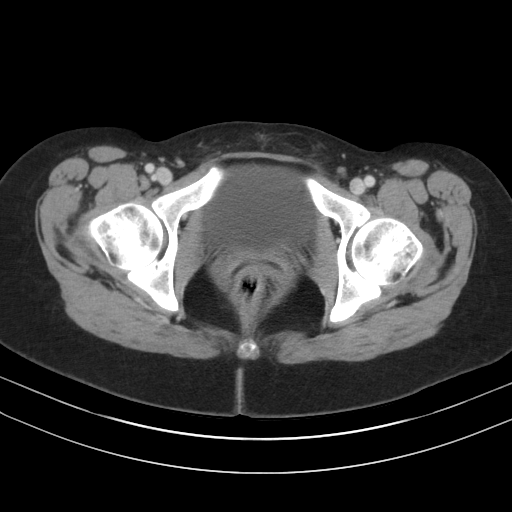
[im 20/95  soft-tissue]
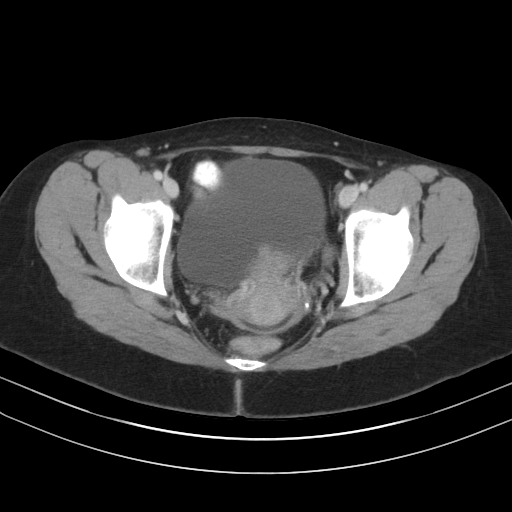
[im 25/95  soft-tissue]
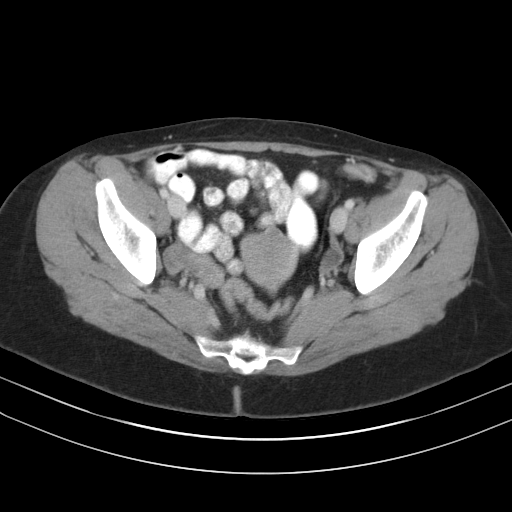
[im 35/95  soft-tissue]
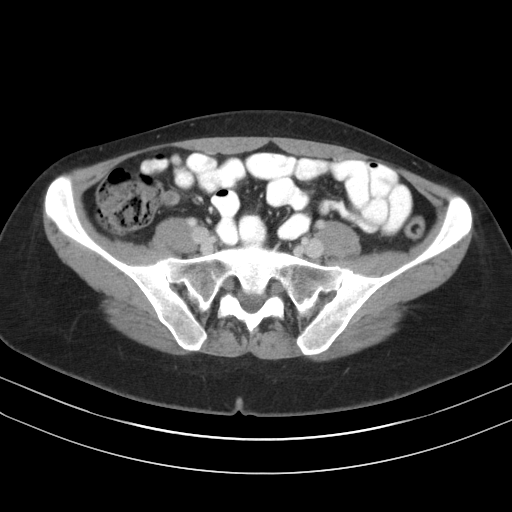
[im 40/95  soft-tissue]
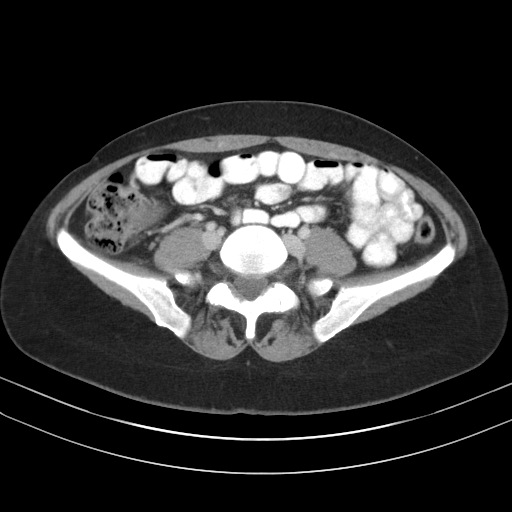
[im 50/95  soft-tissue]
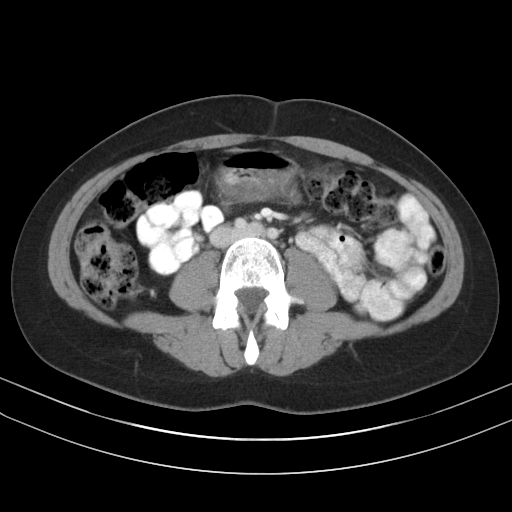
[im 55/95  soft-tissue]
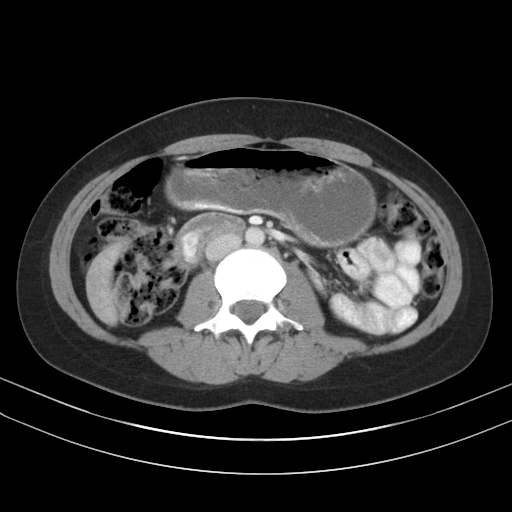
[im 60/95  soft-tissue]
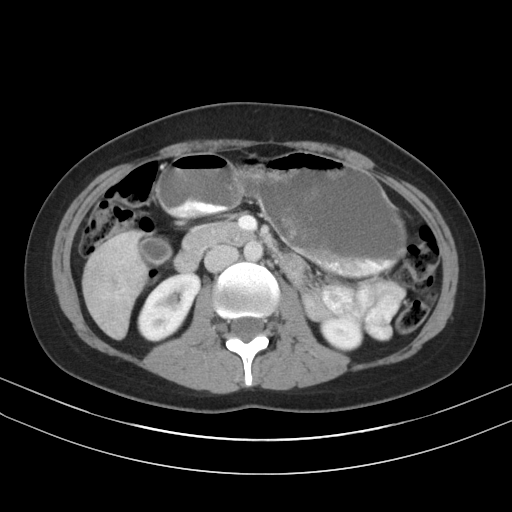
[im 60/95  bone]
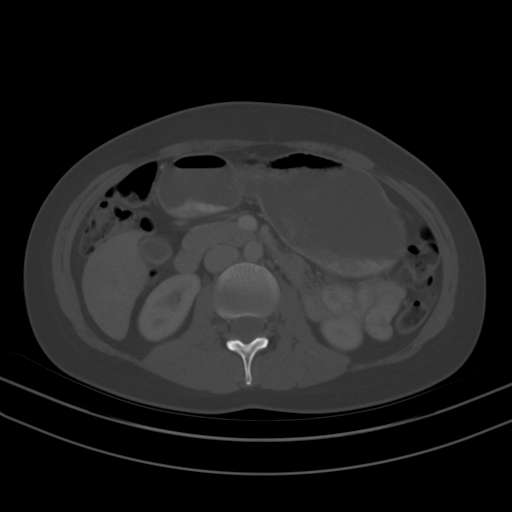
[im 70/95  soft-tissue]
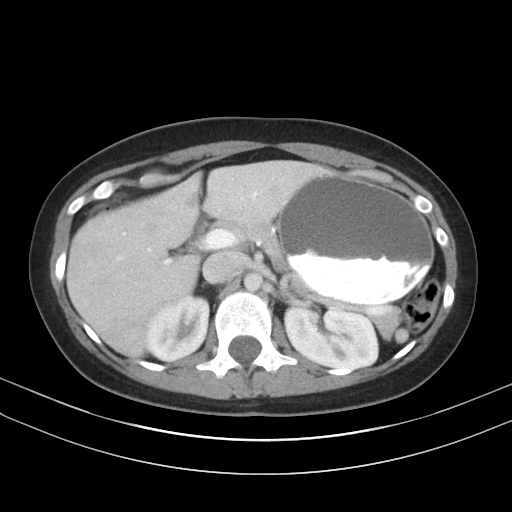
[im 75/95  soft-tissue]
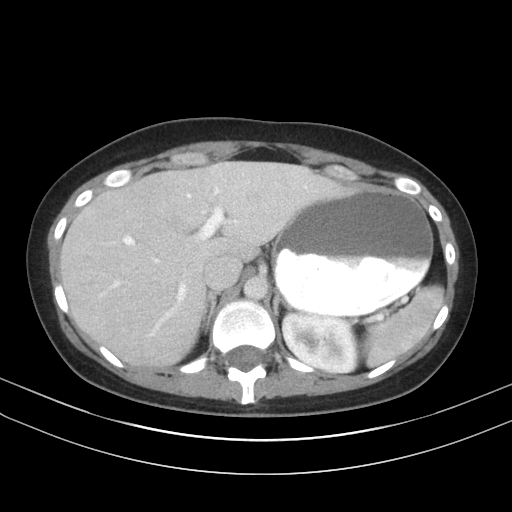
[im 75/95  lung]
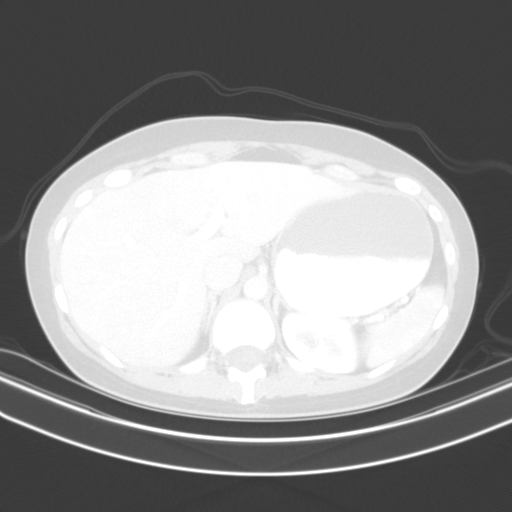
[im 80/95  soft-tissue]
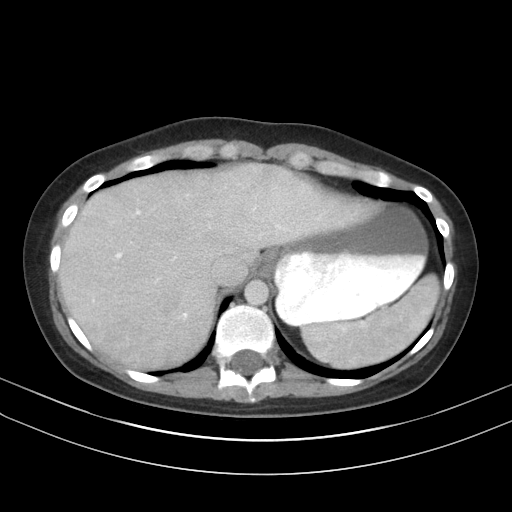
[im 80/95  lung]
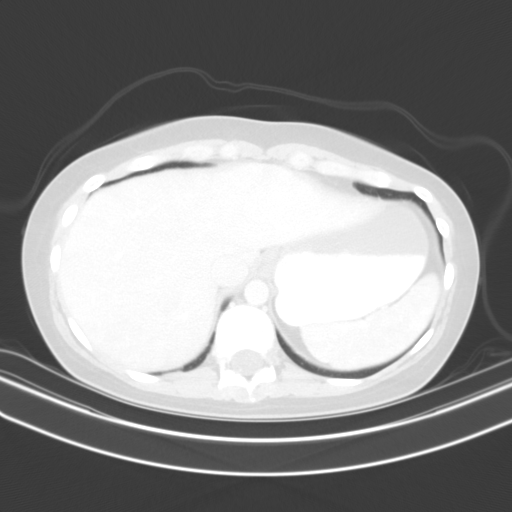
[im 85/95  lung]
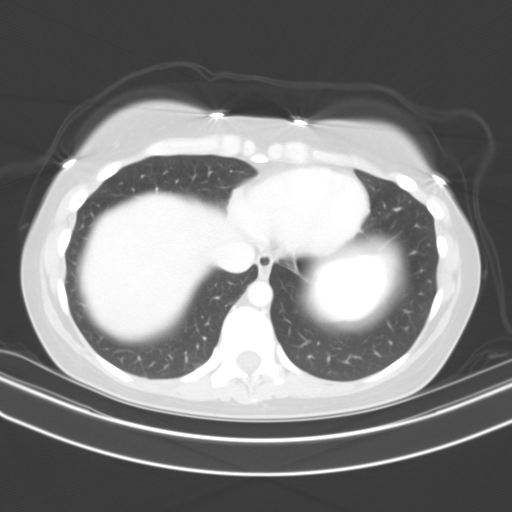
[im 90/95  soft-tissue]
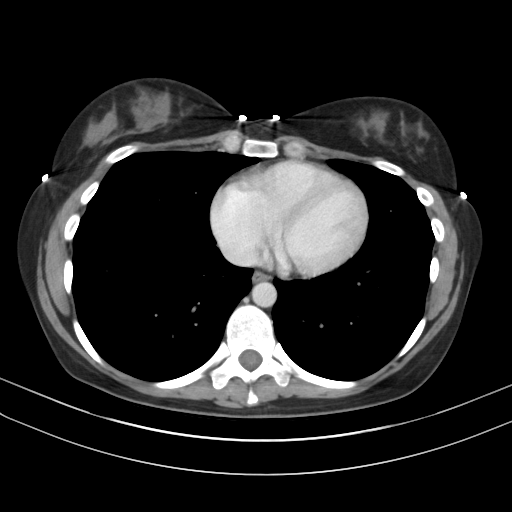
[im 90/95  lung]
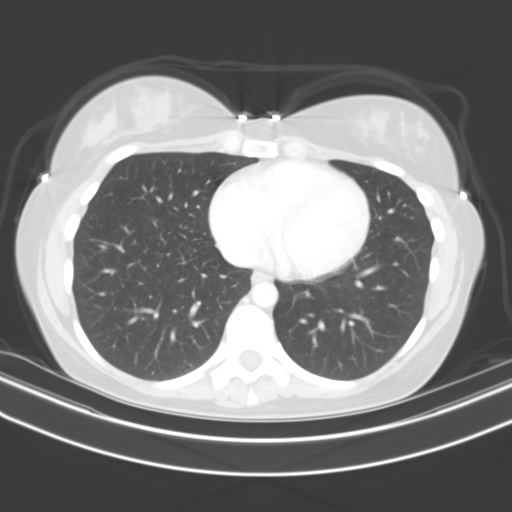

[14 of 32 positions shown; findings below may reference images not displayed]

FINDINGS: Lower chest: Lung bases are clear.

Hepatobiliary: No focal hepatic lesion. No biliary duct dilatation.
Gallbladder is normal. Common bile duct is normal.

Pancreas: Pancreas is normal. No ductal dilatation. No pancreatic
inflammation.

Spleen: Normal spleen

Adrenals/urinary tract: Adrenal glands and kidneys are normal. The
ureters and bladder normal.

Stomach/Bowel: Stomach, small bowel, appendix, and cecum are normal.
The colon and rectosigmoid colon are normal.

Vascular/Lymphatic: Abdominal aorta is normal caliber. No periportal
or retroperitoneal adenopathy. No pelvic adenopathy.

Reproductive: Uterus and ovaries normal. Contraceptive diaphragm
noted.

Other: No free fluid.

Musculoskeletal: No aggressive osseous lesion.
IMPRESSION: 1. No acute findings in the abdomen pelvis.
2. Normal gallbladder and appendix.
3. No abnormality of the bowel identified.

## 2020-01-26 ENCOUNTER — Ambulatory Visit: Payer: No Typology Code available for payment source | Attending: Internal Medicine

## 2020-01-26 DIAGNOSIS — Z23 Encounter for immunization: Secondary | ICD-10-CM

## 2020-01-26 NOTE — Progress Notes (Signed)
   Covid-19 Vaccination Clinic  Name:  Faylinn Schwenn    MRN: [id number] DOB: [birth year]  01/26/2020  Ms. Lewis-Myers was observed post Covid-19 immunization for 15 minutes without incident. She was provided with Vaccine Information Sheet and instruction to access the V-Safe system.   Ms. Phegley was instructed to call 911 with any severe reactions post vaccine: Marland Kitchen Difficulty breathing  . Swelling of face and throat  . A fast heartbeat  . A bad rash all over body  . Dizziness and weakness

## 2020-07-22 ENCOUNTER — Telehealth: Payer: Self-pay

## 2020-07-22 NOTE — Telephone Encounter (Signed)
[employer] Patient Teacher, English as a foreign language completed.    The patient is insured through [employer].  Insurance will pay for Dificid $50.01  Zinplave is not covered on the plan.  We will continue to follow to see if copay assistance is needed.  Ileene Patrick, [facility] Patient [facility] Phone: [phone] Fax:  [phone]

## 2020-07-27 ENCOUNTER — Other Ambulatory Visit: Payer: Self-pay

## 2020-07-27 ENCOUNTER — Encounter: Payer: Self-pay | Admitting: Internal Medicine

## 2020-07-27 ENCOUNTER — Ambulatory Visit (INDEPENDENT_AMBULATORY_CARE_PROVIDER_SITE_OTHER): Payer: No Typology Code available for payment source | Admitting: Internal Medicine

## 2020-07-27 VITALS — BP 112/69 | HR 95 | Temp 97.7°F | Resp 16 | Ht 69.0 in | Wt 147.0 lb

## 2020-07-27 DIAGNOSIS — A0472 Enterocolitis due to Clostridium difficile, not specified as recurrent: Secondary | ICD-10-CM

## 2020-07-27 NOTE — Progress Notes (Signed)
[facility] for Infectious Disease  Reason for [place] cdiff Referring Provider: PCP     Patient Active Problem List   Diagnosis Date Noted  . Ankle sprain 05/15/2018  . Hypotension 04/24/2018  . Palpitations 04/24/2018  . Anxiety and depression 07/27/2017  . Atypical nevi 07/27/2017  . IBS (irritable bowel syndrome) 07/27/2017  . Subclinical hypothyroidism 07/27/2017  . Tachycardia 07/27/2017  . Fibromyalgia 07/26/2017      HPI: Sharon Taylor is a 40 y.o. female hx ibs referred here for cdiff infection  She was seen by pcp/tested on 06/26/20 for gi pcr (cdiff toxin a/b and norovirus positive, normal wbc, platelet, lft, cr), given 14 days PO vancomycin.   Sx include many days of the week for 4 weeks prior to testing 2-7 soft stool to diarrhea. There was associated intermittent. Appetite was normal. No f/c  No prior abx. Works from home. Not around any children. Lives with spouse. No prior cdiff. Not on any PPI. Has IBS, but doesn't take any medication  She had improvement of sx after vancomycin course. She sees me now a month from testing, and she has recently the past few days soft stools. But otherwise feels well. She does have intermitent soft stool with ibs  Lots of question about risk/transmission/retesting  Spouse is assymptomatic  I reviewed notes from pcp   She doesn't smoke/use illicits She is a Education officer, museum  Review of Systems: ROS Other ros negative      Past Medical History:  Diagnosis Date  . Benign joint hypermobility   . Endometriosis   . Fibromyalgia     Social History   Tobacco Use  . Smoking status: Never Smoker  . Smokeless tobacco: Never Used  Substance Use Topics  . Alcohol use: Not Currently  . Drug use: Never    Family History  Problem Relation Age of Onset  . COPD Mother   . Heart attack Father 57  . Heart disease Maternal Grandfather   . Heart attack Maternal Grandfather   . Hypertension  Paternal Grandfather     No Known Allergies  OBJECTIVE: Vitals:   07/27/20 0905  BP: 112/69  Pulse: 95  Resp: 16  Temp: 97.7 F (36.5 C)  SpO2: 96%  Weight: 147 lb (66.7 kg)  Height: 5\' 9"  (1.753 m)   Body mass index is 21.71 kg/m.   Physical Exam HENT:     Head: Normocephalic.     Mouth/Throat:     Mouth: Mucous membranes are moist.     Pharynx: Oropharynx is clear.  Eyes:     Pupils: Pupils are equal, round, and reactive to light.  Cardiovascular:     Rate and Rhythm: Normal rate and regular rhythm.  Pulmonary:     Effort: Pulmonary effort is normal.  Abdominal:     General: Abdomen is flat.     Palpations: Abdomen is soft.  Musculoskeletal:        General: Normal range of motion.     Cervical back: Normal range of motion.  Skin:    General: Skin is warm.  Neurological:     General: No focal deficit present.     Mental Status: She is alert. She is disoriented.  Psychiatric:        Mood and Affect: Mood normal.     Lab: 2/11 osh labs Wbc 7; cr 0.8; lft wnl  Microbiology:  Serology: Gi pcr with cdiff toxin a/b and norovirus positive  Imaging:  Assessment/plan: Problem List Items Addressed This Visit   None   Visit Diagnoses    C. difficile colitis    -  Primary      See avs.  Discussed pathogenesis/history/transmission/colonization/testing of cdiff, and also of norovirus Discussed isolation precaution  Patient had sx 2 month ago, treated with 14 days of vanc and resolved. Currently has ibs sx. Nontoxic on exam  Currently doesn't need further treatment/testing/isolation  -f/u as needed if >3x/day diarrhea along with f/c/abd pain/n/v -continue symptomatic management of soft stool/ibs   I spent 60 minute reviewing data/chart, and coordinating care and >50% direct face to face time providing counseling/discussing diagnostics/treatment plan with patient      Follow-up: Return if symptoms worsen or fail to improve.  Jabier Mutton,  [facility] [phone] pager   [phone] cell 07/27/2020, 9:13 AM

## 2020-07-27 NOTE — Patient Instructions (Signed)
We discussed transmission, history, and colonization with cdiff, and relapse rate of around 20%  At this time, after a GI infection you could experience sx of IBS for several weeks. I would observe and manage conservatively unless persistent > 3x/day of diarrhea with abd pain or fever/chill that are persistent, in which case we can consider retreating for cdiff.  Call our clinic as needed

## 2020-08-04 ENCOUNTER — Telehealth: Payer: Self-pay | Admitting: Gastroenterology

## 2020-08-04 NOTE — Telephone Encounter (Signed)
Awaiting those records for review.  

## 2020-08-04 NOTE — Telephone Encounter (Signed)
Good afternoon Dr. Tarri Glenn, we received a referral for patient to be seen for C. Difficile diarrhea.  She saw other GI provider in 06/2020, but did not have a good experience with them and would like to transfer to [facility].  Records will be sent to you.  Can you please review and advise on scheduling.  Thank you.

## 2020-09-15 NOTE — Telephone Encounter (Signed)
Records placed on my desk today for review.  Referred by Dr. Doreene Nest for chronic abdominal pain.   Followed by Dr. 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  Treated for C diff with vancomycin earlier this year by Dr. Collene Mares and ID. Stool study at that time also positive for norovirus. Colonoscopy recommended at age 72.  Prescribed Bentyl by Dr. Doreene Nest.  CT abd/pelvis with contrast 10/26/17 for LLQ pain and upper abdominal pain: no acute findings  PMH: Fibromyalgia, joint hypermobility, seasonal allergies, gluten sensitivity  Family history: Paternal aunt with ovarian cancer.   Used to live in [place]

## 2020-09-17 ENCOUNTER — Encounter: Payer: Self-pay | Admitting: Gastroenterology

## 2020-09-17 NOTE — Telephone Encounter (Signed)
Called patient and scheduled her for 11/11/20.

## 2020-11-11 ENCOUNTER — Ambulatory Visit (INDEPENDENT_AMBULATORY_CARE_PROVIDER_SITE_OTHER): Payer: No Typology Code available for payment source | Admitting: Gastroenterology

## 2020-11-11 ENCOUNTER — Encounter: Payer: Self-pay | Admitting: Gastroenterology

## 2020-11-11 VITALS — BP 118/60 | HR 88 | Ht 68.0 in | Wt 150.0 lb

## 2020-11-11 DIAGNOSIS — K219 Gastro-esophageal reflux disease without esophagitis: Secondary | ICD-10-CM | POA: Diagnosis not present

## 2020-11-11 DIAGNOSIS — Z8379 Family history of other diseases of the digestive system: Secondary | ICD-10-CM

## 2020-11-11 DIAGNOSIS — R194 Change in bowel habit: Secondary | ICD-10-CM | POA: Diagnosis not present

## 2020-11-11 NOTE — Patient Instructions (Addendum)
It was my pleasure to provide care to you you today. Based on our discussion, I am providing you with my recommendations below:  RECOMMENDATION(S):   Modifying diet and lifestyle remains the foundation for treating the symptoms of reflux.   The following strategies help you prevent heartburn and other symptoms by avoiding foods that reduce the effectiveness of the bottom of the esophagus from protecting the esophagus from from acid injury and keeping stomach contents where they belong.  Eat smaller meals. A large meal remains in the stomach for several hours, increasing the chances for gastroesophageal reflux. Try distributing your daily food intake over three, four, or five smaller meals.  Relax when you eat. Stress increases the production of stomach acid, so make meals a pleasant, relaxing experience. Sit down. Eat slowly. Chew completely. Play soothing music.  Relax between meals. Relaxation therapies such as deep breathing, meditation, massage, tai chi, or yoga may help prevent and relieve heartburn.   Remain upright after eating. You should maintain postures that reduce the risk for reflux for at least three hours after eating. For example, don't bend over or strain to lift heavy objects.  Avoid eating within three hours of going to bed. Lying down after eating will increase chances of reflux.  Lose weight. Excess pounds increase pressure on the stomach and can push acid into the esophagus.  Loosen up. Avoid tight belts, waistbands, and other clothing that puts pressure on your stomach.  Avoid foods that burn. Abstain from food or drink that increases gastric acid secretion, decreases the valve at the bottom of the esophagus, or slows the emptying of the stomach. Known offenders include high-fat foods, spicy dishes, tomatoes and tomato products, citrus fruits, garlic, onions, milk, carbonated drinks, coffee (including decaf), tea, chocolate, mints, and alcohol.  Avoid medications that  can predispose you to reflux including aspirin and other NSAIDs, oral contraceptives, hormone therapy drugs, and certain antidepressants.  Sleep at an angle. If you're bothered by nighttime heartburn, place a wedge (available in medical supply stores or a wedge pillow through [employer]) under your upper body. But don't elevate your head with extra pillows. That makes reflux worse by bending you at the waist and compressing your stomach. You might also try sleeping on your left side, as studies have shown this can help--perhaps because the stomach is on the left side of the body, so lying on your left positions most of the stomach below the bottom of the esophagus.   COLONOSCOPY AND ENDOSCOPY:   You have been scheduled for an endoscopy and a colonoscopy. Please follow the written instructions given to you at your visit today.  PREP:   Please pick up your prep supplies at the pharmacy within the next 1-3 days.  INHALERS:   If you use inhalers (even only as needed), please bring them with you on the day of your procedure.  COLONOSCOPY TIPS:  To reduce nausea and dehydration, stay well hydrated for 3-4 days prior to the exam.  To prevent skin/hemorrhoid irritation - prior to wiping, put A&Dointment or vaseline on the toilet paper. Keep a towel or pad on the bed.  BEFORE STARTING YOUR PREP, drink  64oz of clear liquids in the morning. This will help to flush the colon and will ensure you are well hydrated!!!!  NOTE - This is in addition to the fluids required for to complete your prep. Use of a flavored hard candy, such as grape Anise Salvo, can counteract some of the flavor of the  prep and may prevent some nausea.   FOLLOW UP:  After your procedure, you will receive a call from my office staff regarding my recommendation for follow up.  BMI:  If you are age 39 or younger, your body mass index should be between 19-25. Your There is no height or weight on file to calculate BMI. If this is out  of the aformentioned range listed, please consider follow up with your Primary Care Provider.   MY CHART:  The [facility] providers would like to encourage you to use [facility] to communicate with providers for non-urgent requests or questions.  Due to long hold times on the telephone, sending your provider a message by [facility] may be a faster and more efficient way to get a response.  Please allow 48 business hours for a response.  Please remember that this is for non-urgent requests.   Thank you for trusting me with your gastrointestinal care!    Thornton Park, MD, MPH

## 2020-11-11 NOTE — Progress Notes (Signed)
Referring Provider: Katherina Mires, MD Primary Care Physician:  Katherina Mires, MD  Reason for Consultation:  Otho Darner   IMPRESSION:  Altered by habits following C diff and norovirus Recent C diff and norovirus 2022 Reflux with intermittent, atypical chest pain IBS Family history of Barrett's Esophagus (father) Hypermobility Prefers to avoid medications  Altered bowel habits following C diff and norovirus may be post-infectious IBS and chronic dyspepsia. However, must exclude IBD given persistently altered bowel habits and history of C diff. SIBO is also a possibility.     PLAN: - EGD to evaluate for reflux esophagitis as diagnosis will change out management - Colonoscopy to evaluate/exclude colitis  Please see the "Patient Instructions" section for addition details about the plan.  HPI: Sharon Taylor is a 40 y.o. female referred by Dr. Doreene Nest. The history is obtained through the patient, review of referral records, review of records from Dr. Collene Mares, and review of her electronic health record.  She uses the notes that she made on her phone to guide our conversation. 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 She has a history of anxiety, depression, fibromyalgia, subclinical hypothyroidism, Ehlers Danlos Hypermobile type, dysmenorrhea, orthostatic intolerance, hypotension, chronic fatigue, PMDD, seasonal allergies, gluten intolerance. and likely endometriosis.   Followed by Dr. Collene Mares - last seen 07/07/20 for diarrhea, C diff, gluten sensitivity identified in 2018 and diarrhea-predominant IBS diagnosed at age 63 not requiring therapy.  Treated for C diff with vancomycin earlier this year by Dr. Collene Mares and ID. Stool study at that time also positive for norovirus. Colonoscopy recommended at age 77.  Prescribed Bentyl by Dr. Doreene Nest.  Finds that her "gut has been disrupted" following the C diff infection earlier this year. She is now  having frequent soft stools. Will have 2-5 BM daily.   Dr. Collene Mares diagnosed her with GERD. Feels chest pain, palpitations, early satiety. Now with heartburn and trouble sleeping. Tight twisty abdominal pain.  IBS had been treated with avoiding gluten. Follows a vegan diet. No longer drinks a lot of alcohol. Now rarely drinks a glass of wine twice weekly.  Celiac serologies have been negative over the years.   Would like to avoid all medications.    CT abd/pelvis with contrast 10/26/17 for LLQ pain and upper abdominal pain: no acute findings  No recent abdominal imaging. No prior endoscopy.   Labs 06/26/20: normal CBC. Normal CMP.  Normal TSH 2021.    Family history: Paternal aunt with ovarian cancer. Father with long standing history of GERD complicated by Barrett's Esophagus. No known family history of colon cancer or polyps. No other known family history of uterine/endometrial cancer, pancreatic cancer or gastric/stomach cancer.   Used to live in [place]     Past Medical History:  Diagnosis Date   Anxiety    Benign joint hypermobility    Depression    Ehlers-Danlos syndrome    Endometriosis    Fibromyalgia    GERD (gastroesophageal reflux disease)    IBS (irritable bowel syndrome)    Thyroid disease     History reviewed. No pertinent surgical history.  Current Outpatient Medications  Medication Sig Dispense Refill   bismuth subsalicylate (PEPTO BISMOL) 262 MG/15ML suspension Take 30 mLs by mouth as needed.     carboxymethylcellul-glycerin (REFRESH OPTIVE) 0.5-0.9 % ophthalmic solution Place 1 drop into both eyes 2 (two) times daily.     cetirizine (ZYRTEC) 10 MG tablet Take 10 mg by mouth daily.  cholecalciferol (VITAMIN D) 1000 units tablet Take 2,000 Units by mouth daily.     famotidine (PEPCID) 20 MG tablet Take 20 mg by mouth as needed for heartburn or indigestion.     Methylcobalamin (B12-ACTIVE PO) Take 2,500 mcg by mouth 2 (two) times a week.     NON FORMULARY  Take 5-10 mg by mouth as needed (for anxiety). CBD Oil ([name])     NUVARING 0.12-0.015 MG/24HR vaginal ring Place 1 each vaginally every 28 (twenty-eight) days.   1   propranolol (INDERAL) 10 MG tablet Take 1 tablet (10 mg total) by mouth daily as needed (RAPID HEARTRATE). 30 tablet 1   No current facility-administered medications for this visit.    Allergies as of 11/11/2020   (No Known Allergies)    Family History  Problem Relation Age of Onset   COPD Mother    Heart attack Father 48   Heart disease Maternal Grandfather    Heart attack Maternal Grandfather    Hypertension Paternal Grandfather    Ovarian cancer Paternal Aunt     Social History   Socioeconomic History   Marital status: Married    Spouse name: Not on file   Number of children: Not on file   Years of education: Not on file   Highest education level: Not on file  Occupational History   Not on file  Tobacco Use   Smoking status: Never   Smokeless tobacco: Never  Substance and Sexual Activity   Alcohol use: Not Currently   Drug use: Never   Sexual activity: Not on file  Other Topics Concern   Not on file  Social History Narrative   Social worker.    Social Determinants of Health   Financial Resource Strain: Not on file  Food Insecurity: Not on file  Transportation Needs: Not on file  Physical Activity: Not on file  Stress: Not on file  Social Connections: Not on file  Intimate Partner Violence: Not on file    Review of Systems: 12 system ROS is negative except as noted above with the additions of allergies, fatigue, menstrual pain, muscle pains, night sweats, shortness of breath, sleeping problems, swelling of feet/legs and swollen lymph glands.   Physical Exam: General:   Alert,  well-nourished, pleasant and cooperative in NAD Head:  Normocephalic and atraumatic. Eyes:  Sclera clear, no icterus.   Conjunctiva pink. Ears:  Normal auditory acuity. Nose:  No deformity, discharge,  or  lesions. Mouth:  No deformity or lesions.   Neck:  Supple; no masses or thyromegaly. Lungs:  Clear throughout to auscultation.   No wheezes. Heart:  Regular rate and rhythm; no murmurs. Abdomen:  Soft, nontender, nondistended, normal bowel sounds, no rebound or guarding. No hepatosplenomegaly.   Rectal:  Deferred  Msk:  Symmetrical. No boney deformities LAD: No inguinal or umbilical LAD Extremities:  No clubbing or edema. Neurologic:  Alert and  oriented x4;  grossly nonfocal Skin:  Intact without significant lesions or rashes. Psych:  Alert and cooperative. Normal mood and affect.     Hebe Merriwether L. Tarri Glenn, MD, MPH 11/11/2020, 3:27 PM

## 2020-12-01 ENCOUNTER — Ambulatory Visit (AMBULATORY_SURGERY_CENTER): Payer: No Typology Code available for payment source | Admitting: Gastroenterology

## 2020-12-01 ENCOUNTER — Encounter: Payer: Self-pay | Admitting: Gastroenterology

## 2020-12-01 ENCOUNTER — Other Ambulatory Visit: Payer: Self-pay

## 2020-12-01 VITALS — BP 100/68 | HR 68 | Temp 98.8°F | Resp 23 | Ht 68.0 in | Wt 150.0 lb

## 2020-12-01 DIAGNOSIS — K219 Gastro-esophageal reflux disease without esophagitis: Secondary | ICD-10-CM | POA: Diagnosis not present

## 2020-12-01 DIAGNOSIS — K2970 Gastritis, unspecified, without bleeding: Secondary | ICD-10-CM

## 2020-12-01 DIAGNOSIS — Z8379 Family history of other diseases of the digestive system: Secondary | ICD-10-CM

## 2020-12-01 DIAGNOSIS — K6389 Other specified diseases of intestine: Secondary | ICD-10-CM

## 2020-12-01 DIAGNOSIS — K319 Disease of stomach and duodenum, unspecified: Secondary | ICD-10-CM | POA: Diagnosis not present

## 2020-12-01 DIAGNOSIS — R194 Change in bowel habit: Secondary | ICD-10-CM

## 2020-12-01 DIAGNOSIS — K3189 Other diseases of stomach and duodenum: Secondary | ICD-10-CM

## 2020-12-01 DIAGNOSIS — K2950 Unspecified chronic gastritis without bleeding: Secondary | ICD-10-CM

## 2020-12-01 MED ORDER — SODIUM CHLORIDE 0.9 % IV SOLN: 500.0000 mL | Freq: Once | INTRAVENOUS | Status: AC

## 2020-12-01 NOTE — Progress Notes (Signed)
Report to PACU, RN, vss, BBS= Clear.  

## 2020-12-01 NOTE — Patient Instructions (Signed)
YOU HAD AN ENDOSCOPIC PROCEDURE TODAY AT [facility]:   Refer to the procedure report that was given to you for any specific questions about what was found during the examination.  If the procedure report does not answer your questions, please call your gastroenterologist to clarify.  If you requested that your care partner not be given the details of your procedure findings, then the procedure report has been included in a sealed envelope for you to review at your convenience later.  YOU SHOULD EXPECT: Some feelings of bloating in the abdomen. Passage of more gas than usual.  Walking can help get rid of the air that was put into your GI tract during the procedure and reduce the bloating. If you had a lower endoscopy (such as a colonoscopy or flexible sigmoidoscopy) you may notice spotting of blood in your stool or on the toilet paper. If you underwent a bowel prep for your procedure, you may not have a normal bowel movement for a few days.  Please Note:  You might notice some irritation and congestion in your nose or some drainage.  This is from the oxygen used during your procedure.  There is no need for concern and it should clear up in a day or so.  SYMPTOMS TO REPORT IMMEDIATELY:  Following lower endoscopy (colonoscopy or flexible sigmoidoscopy):  Excessive amounts of blood in the stool  Significant tenderness or worsening of abdominal pains  Swelling of the abdomen that is new, acute  Fever of 100F or higher  Following upper endoscopy (EGD)  Vomiting of blood or coffee ground material  New chest pain or pain under the shoulder blades  Painful or persistently difficult swallowing  New shortness of breath  Fever of 100F or higher  Black, tarry-looking stools  For urgent or emergent issues, a gastroenterologist can be reached at any hour by calling [phone]. Do not use MyChart messaging for urgent concerns.    DIET:  We do recommend a small meal at first, but  then you may proceed to your regular diet.  Drink plenty of fluids but you should avoid alcoholic beverages for 24 hours.  MEDICATIONS: Continue present medications.  Please see handouts given to you by your recovery nurse.  Thank you for allowing [place] to provide for your healthcare needs today.  ACTIVITY:  You should plan to take it easy for the rest of today and you should NOT DRIVE or use heavy machinery until tomorrow (because of the sedation medicines used during the test).    FOLLOW UP: Our staff will call the number listed on your records 48-72 hours following your procedure to check on you and address any questions or concerns that you may have regarding the information given to you following your procedure. If we do not reach you, we will leave a message.  We will attempt to reach you two times.  During this call, we will ask if you have developed any symptoms of COVID 19. If you develop any symptoms (ie: fever, flu-like symptoms, shortness of breath, cough etc.) before then, please call [phone].  If you test positive for Covid 19 in the 2 weeks post procedure, please call and report this information to [place].    If any biopsies were taken you will be contacted by phone or by letter within the next 1-3 weeks.  Please call us at [phone] if you have not heard about the biopsies in 3 weeks.    SIGNATURES/CONFIDENTIALITY: You and/or your  care partner have signed paperwork which will be entered into your electronic medical record.  These signatures attest to the fact that that the information above on your After Visit Summary has been reviewed and is understood.  Full responsibility of the confidentiality of this discharge information lies with you and/or your care-partner.

## 2020-12-01 NOTE — Progress Notes (Signed)
Medical history reviewed with no changes noted. VS assessed by C.W 

## 2020-12-01 NOTE — Progress Notes (Signed)
Called to room to assist during endoscopic procedure.  Patient ID and intended procedure confirmed with present staff. Received instructions for my participation in the procedure from the performing physician.  

## 2020-12-01 NOTE — Op Note (Signed)
[facility] Patient Name: Sharon Taylor Procedure Date: 12/01/2020 7:32 AM MRN: [id number] Endoscopist: Thornton Park MD, MD Age: 40 Referring MD:  Date of Birth: 1980-05-28 Gender: Female Account #: [id number] Procedure:                Upper GI endoscopy Indications:              Unexplained chest pain, reflux                           Altered by habits following C diff and norovirus                           Family history of Barrett's Esophagus (father) Medicines:                Monitored Anesthesia Care Procedure:                Pre-Anesthesia Assessment:                           - Prior to the procedure, a History and Physical                            was performed, and patient medications and                            allergies were reviewed. The patient's tolerance of                            previous anesthesia was also reviewed. The risks                            and benefits of the procedure and the sedation                            options and risks were discussed with the patient.                            All questions were answered, and informed consent                            was obtained. Prior Anticoagulants: The patient has                            taken no previous anticoagulant or antiplatelet                            agents. ASA Grade Assessment: II - A patient with                            mild systemic disease. After reviewing the risks                            and benefits, the patient was deemed in  satisfactory condition to undergo the procedure.                           After obtaining informed consent, the endoscope was                            passed under direct vision. Throughout the                            procedure, the patient's blood pressure, pulse, and                            oxygen saturations were monitored continuously. The                            GIF [id number] #[id number]  was introduced through the                            mouth, and advanced to the third part of duodenum.                            The upper GI endoscopy was accomplished without                            difficulty. The patient tolerated the procedure                            well. Scope In: Scope Out: Findings:                 The examined esophagus was normal. The z-line is                            located 36 cm from the incisors. Biopsies were                            obtained from the mid/proximal and distal esophagus                            with cold forceps for histology.                           Patchy mildly erythematous mucosa without bleeding                            was found in the gastric body. Biopsies were taken                            from the antrum, body, and fundus with a cold                            forceps for histology. Estimated blood loss was  minimal.                           The examined duodenum was normal. Biopsies were                            taken with a cold forceps for histology. Estimated                            blood loss was minimal.                           The cardia and gastric fundus were normal on                            retroflexion.                           The exam was otherwise without abnormality. Complications:            No immediate complications. Estimated blood loss:                            Minimal. Estimated Blood Loss:     Estimated blood loss was minimal. Impression:               - Normal esophagus. Biopsied.                           - Mildly erythematous mucosa in the gastric body.                            Biopsied.                           - Normal examined duodenum. Biopsied.                           - The examination was otherwise normal. Recommendation:           - Patient has a contact number available for                            emergencies. The signs and  symptoms of potential                            delayed complications were discussed with the                            patient. Return to normal activities tomorrow.                            Written discharge instructions were provided to the                            patient.                           -  Resume previous diet.                           - Continue present medications.                           - Await pathology results. Thornton Park MD, MD 12/01/2020 8:07:34 AM This report has been signed electronically.

## 2020-12-01 NOTE — Op Note (Signed)
[place] Patient Name: Sharon Taylor Procedure Date: 12/01/2020 7:31 AM MRN: [id number] Endoscopist: Thornton Park MD, MD Age: 40 Referring MD:  Date of Birth: 1980-09-30 Gender: Female Account #: [id number] Procedure:                Colonoscopy Indications:              Change in bowel habits                           Altered by habits following C diff and norovirus                           Recent C diff and norovirus 2022 Medicines:                Monitored Anesthesia Care Procedure:                Pre-Anesthesia Assessment:                           - Prior to the procedure, a History and Physical                            was performed, and patient medications and                            allergies were reviewed. The patient's tolerance of                            previous anesthesia was also reviewed. The risks                            and benefits of the procedure and the sedation                            options and risks were discussed with the patient.                            All questions were answered, and informed consent                            was obtained. Prior Anticoagulants: The patient has                            taken no previous anticoagulant or antiplatelet                            agents. ASA Grade Assessment: II - A patient with                            mild systemic disease. After reviewing the risks                            and benefits, the patient was deemed in  satisfactory condition to undergo the procedure.                           After obtaining informed consent, the colonoscope                            was passed under direct vision. Throughout the                            procedure, the patient's blood pressure, pulse, and                            oxygen saturations were monitored continuously. The                            Olympus CF-HQ190L ([id number]) Colonoscope was                             introduced through the anus and advanced to the 3                            cm into the ileum. A second forward view of the                            right colon was performed. The colonoscopy was                            performed without difficulty. The patient tolerated                            the procedure well. The quality of the bowel                            preparation was good. The terminal ileum, ileocecal                            valve, appendiceal orifice, and rectum were                            photographed. Scope In: 7:46:20 AM Scope Out: 8:01:48 AM Scope Withdrawal Time: 0 hours 11 minutes 51 seconds  Total Procedure Duration: 0 hours 15 minutes 28 seconds  Findings:                 The perianal and digital rectal examinations were                            normal.                           A diffuse area of mildly erythematous mucosa was                            found in the distal sigmoid colon. The rectum was  normal. The findings are mild. Biopsies were taken                            with a cold forceps for histology. Estimated blood                            loss was minimal.                           The remainder of the examined colonic mucosa                            appeared normal. Biopsies were taken from the right                            colon, left colon, and transverse colon with a cold                            forceps for histology. Estimated blood loss was                            minimal.                           The terminal ileum appeared normal. Biopsies were                            taken with a cold forceps for histology. Estimated                            blood loss was minimal.                           The exam was otherwise without abnormality on                            direct and retroflexion views. Complications:            No immediate complications.  Estimated blood loss:                            Minimal. Estimated Blood Loss:     Estimated blood loss was minimal. Impression:               - Erythematous mucosa in the distal sigmoid colon.                            This is of unclear clinical significance and may be                            due to prep artifact. Biopsied.                           - The entire examined colon is otherwise normal.  Biopsied.                           - The examined portion of the ileum was normal.                            Biopsied. Recommendation:           - Patient has a contact number available for                            emergencies. The signs and symptoms of potential                            delayed complications were discussed with the                            patient. Return to normal activities tomorrow.                            Written discharge instructions were provided to the                            patient.                           - Resume previous diet.                           - Continue present medications.                           - Await pathology results.                           - Emerging evidence supports eating a diet of                            fruits, vegetables, grains, calcium, and yogurt                            while reducing red meat and alcohol may reduce the                            risk of colon cancer. Thornton Park MD, MD 12/01/2020 8:11:18 AM This report has been signed electronically.

## 2020-12-07 ENCOUNTER — Encounter: Payer: Self-pay | Admitting: Gastroenterology

## 2020-12-08 ENCOUNTER — Other Ambulatory Visit: Payer: Self-pay

## 2020-12-08 MED ORDER — PANTOPRAZOLE SODIUM 40 MG PO TBEC: 40.0000 mg | DELAYED_RELEASE_TABLET | Freq: Every day | ORAL | 1 refills | Status: DC

## 2020-12-16 NOTE — Telephone Encounter (Signed)
Chart reviewed, including recent endoscopic procedures and surgical pathology. This does not sound like anything worrisome.  The very recent endoscopic findings and pathology are quite reassuring.  I recommend 2 tablespoons of Metamucil daily in 14 ounces of water or juice.  This should improve her bowel consistency.  She should feel free to give [place] an office follow-up in 1 week, on how she is doing.  Further plans, if any, per Dr. Tarri Glenn at that time.

## 2020-12-22 DIAGNOSIS — R42 Dizziness and giddiness: Secondary | ICD-10-CM | POA: Insufficient documentation

## 2021-02-04 ENCOUNTER — Other Ambulatory Visit: Payer: Self-pay | Admitting: Gastroenterology

## 2021-03-09 ENCOUNTER — Other Ambulatory Visit: Payer: Self-pay | Admitting: Gastroenterology

## 2021-07-30 ENCOUNTER — Encounter: Payer: Self-pay | Admitting: Gastroenterology

## 2021-07-30 ENCOUNTER — Other Ambulatory Visit: Payer: Self-pay

## 2021-07-30 ENCOUNTER — Ambulatory Visit (INDEPENDENT_AMBULATORY_CARE_PROVIDER_SITE_OTHER): Payer: No Typology Code available for payment source | Admitting: Gastroenterology

## 2021-07-30 VITALS — BP 104/62 | HR 96 | Ht 69.0 in | Wt 148.0 lb

## 2021-07-30 DIAGNOSIS — K219 Gastro-esophageal reflux disease without esophagitis: Secondary | ICD-10-CM | POA: Diagnosis not present

## 2021-07-30 DIAGNOSIS — R194 Change in bowel habit: Secondary | ICD-10-CM

## 2021-07-30 MED ORDER — RIFAXIMIN 550 MG PO TABS: 550.0000 mg | ORAL_TABLET | Freq: Three times a day (TID) | ORAL | 0 refills | Status: DC

## 2021-07-30 MED ORDER — DOXYCYCLINE HYCLATE 100 MG PO TABS: 100.0000 mg | ORAL_TABLET | Freq: Two times a day (BID) | ORAL | 0 refills | Status: AC

## 2021-07-30 NOTE — Progress Notes (Signed)
? ?Referring Provider: Katherina Mires, MD ?Primary Care Physician:  Katherina Mires, MD ? ?Reason for Consultation:  C Diff ? ? ?IMPRESSION:  ?Altered by habits following C diff and norovirus ?   - duodenal, colon, and TI biopsies negative 11/2020 ?C diff and norovirus 2022 ?Reflux esophagitis on EGD 11/2020 with intermittent, atypical chest pain ?IBS ?Family history of Barrett's Esophagus (father) ?Hypermobility ?Prefers to avoid medications ? 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 ? ?Recent endoscopy confirms reflux esophagitis. ? ? ?PLAN: ?- Xifaxan 550 mg TID x 14 days, substitute with doxycycline 100 mg BID x 14 days if Xifaxan is cost prohibitive ?- Low-FODMAP diet ?- Office follow-up in 4 months ? ?Please see the "Patient Instructions" section for addition details about the plan. ? ?HPI: Sharon Taylor is a 41 y.o. female who returns in follow-up after endoscopic evaluation.  She uses a list of notes on her phone to guide our visit today. She has a history of anxiety, depression, fibromyalgia, subclinical hypothyroidism, Ehlers Danlos Hypermobile type, dysmenorrhea, orthostatic intolerance, chronic fatigue, PMDD, seasonal allergies, gluten intolerance, and likely endometriosis.  ? ?Previously followed by Dr. Collene Mares - last seen 07/07/20 for diarrhea, C diff, gluten sensitivity identified in 2018 and diarrhea-predominant IBS diagnosed at age 48 not requiring therapy.  Treated for C diff with vancomycin by Dr. Collene Mares and ID 06/2020. Stool study at that time also positive for norovirus. Colonoscopy recommended at age 89 Prescribed Bentyl by Dr. Doreene Nest. ? ?Dr. Collene Mares diagnosed her with GERD. Feels chest pain, palpitations, early satiety. Now with heartburn and trouble sleeping. Tight twisty abdominal pain. ? ?IBS had been treated with avoiding gluten. Follows a vegan diet. No longer drinks a lot of alcohol. Now rarely drinks a glass of wine twice weekly.  Celiac serologies have been negative over the years.   ? ?At the time of her consultation with me 11/11/20 she felt that her "gut has been disrupted" following the C diff infection earlier this year. She is now having frequent soft stools. Will have 2-5 BM daily.  ? ?Endoscopic evaluation performed 12/11/20:  ?- EGD 12/01/2020 showed gastritis.  Esophageal biopsies showed focal intraepithelial eosinophils up to 11 per high-powered field in the mid and proximal esophagus, distal esophageal biopsies were consistent with reflux, gastric biopsies showed chronic gastritis without H. pylori, and duodenal biopsies were normal. ?- Colonoscopy 12/01/2020 showed distal sigmoid erythema and was otherwise normal.  Colon and terminal ileal biopsies were normal. ? ?She returns today after compliance with several months of pantoprazole therapy. She felt the PPI therpay helped with the reflux but worsened her abdominal pain. She is concerned that she might have low acid. She will be fine for several weeks but then finds the symptoms returned. ? ?She has raised the head of her bed and is avoiding eating at night to avoid heartburn.. ? ?Using Pepcid PRN.  ? ?She finds that she feels better if she doesn't eat. Any food will result in brain fog and nausea.  ? ?Lower GI symptoms improve with high fiber foods. But, reintroducing these foods worsens her upper GI tract symptoms. ? ?She hasn't been able to figure out what to do. She doesn't like the strategy of eating very small amounts. ? ?Bowel habits are swining back and forth but she will have times that she finds that she empties out completely. She will use Imodium PRN. Feels that she is stuck in a cycle. ? ?Cannot tolerate raw vegetables. Feels better when she is eating less healthy  foods - uses traveling as an example.  ? ?She has added back gluten and fruit juices without a worsening in symptoms.  ? ?Alcohol can actually improve her symptoms and she wonders if this is ultimately related to anxiety.  ? ?Saw an allergist in [place] 6-7  years ago.  ? ?Prior abdominal imaging:  ?- CT abd/pelvis with contrast 10/26/17 for LLQ pain and upper abdominal pain: no acute findings ? ? ?Labs 06/26/20: normal CBC. Normal CMP.  ?Normal TSH 2021.  ? ?Prior endoscopic evaluation: ?- EGD 12/01/2020 showed gastritis.  Esophageal biopsies showed focal intraepithelial eosinophils up to 11 per high-powered field in the mid and proximal esophagus, distal esophageal biopsies were consistent with reflux, gastric biopsies showed chronic gastritis without H. pylori, and duodenal biopsies were normal. ?- Colonoscopy 12/01/2020 showed distal sigmoid erythema and was otherwise normal.  Colon and terminal ileal biopsies were normal. ?  ?Family history: Paternal aunt with ovarian cancer. Father with long standing history of GERD complicated by Barrett's Esophagus. No known family history of colon cancer or polyps. No other known family history of uterine/endometrial cancer, pancreatic cancer or gastric/stomach cancer. ? ? ? ? ?Past Medical History:  ?Diagnosis Date  ? Anxiety   ? Benign joint hypermobility   ? Depression   ? Ehlers-Danlos syndrome   ? Endometriosis   ? Fibromyalgia   ? GERD (gastroesophageal reflux disease)   ? IBS (irritable bowel syndrome)   ? Thyroid disease   ? ? ?History reviewed. No pertinent surgical history. ? ? ?Allergies as of 07/30/2021  ? (No Known Allergies)  ? ? ?Family History  ?Problem Relation Age of Onset  ? COPD Mother   ? Heart attack Father 58  ? Heart disease Maternal Grandfather   ? Heart attack Maternal Grandfather   ? Hypertension Paternal Grandfather   ? Ovarian cancer Paternal Aunt   ? Colon cancer Neg Hx   ? Stomach cancer Neg Hx   ? Esophageal cancer Neg Hx   ? Pancreatic cancer Neg Hx   ? ? ? ? ?Physical Exam: ?General:   Alert,  well-nourished, pleasant and cooperative in NAD ?Head:  Normocephalic and atraumatic. ?Eyes:  Sclera clear, no icterus.   Conjunctiva pink. ?Abdomen:  Soft, nontender, nondistended, normal bowel sounds, no  rebound or guarding. No hepatosplenomegaly.   ?Neurologic:  Alert and  oriented x4;  grossly nonfocal ?Skin:  Intact without significant lesions or rashes. ?Psych:  Alert and cooperative. Normal mood and affect. ? ? ? ? ?Madeline Pho L. Tarri Glenn, MD, MPH ?07/30/2021, 9:41 AM ? ? ? ?  ?

## 2021-07-30 NOTE — Patient Instructions (Addendum)
It was my pleasure to provide care to you today. Based on our discussion, I am providing you with my recommendations below: ? ?RECOMMENDATION(S):  ? ?We discussed a trial of 14 days of Xifaxan 550 mg three time daily. ? ?Some dietary tips that may help relief symptoms include: ?- Drink plenty of water and avoid carbonated beverages, which may cause gas ?- Don't chew gum or eat too quickly; these behaviors can cause you to swallow air, which can cause gas. ?- Try eating smaller meals more frequently and avoid large meals. ?- Try increasing fiber in your diet gradually. Fiber can help reduce both constipation and diarrhea, but it can also make gas and cramping worse. I recommended slowing increasing the amount of fiber that you eat. ?- Avoid foods and beverages that make you fee worse. For some people these include alcohol, drinks with caffeine, dairy products, beans, cauliflower, cabbage, and broccoli.  ? ?An alternative to the antibiotics would be a trial of the low FODMAP diet to try to identify the foods that bother you. This stands for "fermentable oligodi-monosaccharides and polyols," or, more simply, certain types of carbohydrates found in foods that are hard to digest. By following FODMAP, also known as a low-FODMAP diet, you avoid or limit these particular carbohydrates. Some of the foods that contain FODMAPs include: ?- Fruits such as apples, apricots, blackberries, cherries, mango, nectarines, pears, plums, and watermelon, or juice containing any of these fruits ?- Canned fruit in natural fruit juice, or large quantities of fruit juice or dried fruit ?- Vegentables such as artichokes, asparagus, beans, cabbage, cauliflower, garlic and garlic salts, lentils, mushrooms, onions, and sugar snap or snow peas ?- Dairy products such as milk, milk products, soft cheese, yogurt, custard, and ice cream ?- Wheat and rye products ?- Honey and foods with high-fructose corn syrup  ?- Products, including candy and gum,  with sweeteners ending in "-ol" (for example, sorbitol, mannitol, xylitol, and maltitol) ? ?GI symptoms are often made worse by stress, taking steps to manage stress in your life may help relieve symptoms. Some people find that relaxation techniques including breathing exercises, meditation, and yoga help reduce stress. As well, physical activity and adequate sleep can help control stress, as can any activity that you find calming - reading, listening to music, taking a walk with a friend, for example. Counseling and support from counselors, friends, family, and support groups can also help manage stress and help you find ways to avoid or navigate stressful situations. ? ?My favorite websites for more information include: ?[email or web address] ? ?FOLLOW UP: ? ?I would like for you to follow up with me in 4-6 months. Please call the office at [phone] to schedule your appointment. ? ?BMI: ? ?If you are age 74 or younger, your body mass index should be between 19-25. Your Body mass index is 21.86 kg/m?Marland Kitchen If this is out of the aformentioned range listed, please consider follow up with your Primary Care Provider.  ? ?MY CHART: ? ?The [facility] providers would like to encourage you to use [facility] to communicate with providers for non-urgent requests or questions.  Due to long hold times on the telephone, sending your provider a message by [facility] may be a faster and more efficient way to get a response.  Please allow 48 business hours for a response.  Please remember that this is for non-urgent requests.  ? ?Thank you for trusting me with your gastrointestinal care!   ? ?Thornton Park, MD, MPH ? ?

## 2022-02-11 DIAGNOSIS — Z789 Other specified health status: Secondary | ICD-10-CM | POA: Insufficient documentation

## 2022-02-11 DIAGNOSIS — Z91030 Bee allergy status: Secondary | ICD-10-CM | POA: Insufficient documentation

## 2022-03-14 ENCOUNTER — Ambulatory Visit (INDEPENDENT_AMBULATORY_CARE_PROVIDER_SITE_OTHER): Payer: No Typology Code available for payment source | Admitting: Gastroenterology

## 2022-03-14 ENCOUNTER — Encounter: Payer: Self-pay | Admitting: Gastroenterology

## 2022-03-14 VITALS — BP 96/76 | HR 74 | Ht 69.0 in | Wt 151.0 lb

## 2022-03-14 DIAGNOSIS — R194 Change in bowel habit: Secondary | ICD-10-CM | POA: Diagnosis not present

## 2022-03-14 MED ORDER — DOXYCYCLINE HYCLATE 100 MG PO CAPS
100.0000 mg | ORAL_CAPSULE | Freq: Two times a day (BID) | ORAL | 0 refills | Status: AC
Start: 2022-03-14 — End: 2022-03-28

## 2022-03-14 NOTE — Progress Notes (Signed)
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   PLAN: - Doxycycline 100 mg BID x 14 days (script) to use when she would like - SIBO Breath test to screen for methane SIBO - Consideration of FDGuard for additional symptom control (samples provided) - Discussed low SIBO diet and limitation low sugar, no concentrated sweets diet. - Discussed SSRI as a possible strategy for long term symptom management. - Office follow-up in 4 months, earlier if needed - Colonoscopy 2032, earlier if needed  Please see the "Patient Instructions" section for addition details about the plan.  HPI: Sharon Taylor is a 41 y.o. female who returns in follow-up.  She was last seen in the office 07/30/21. She has a history of anxiety, depression, fibromyalgia, subclinical hypothyroidism, Ehlers Danlos Hypermobile type, dysmenorrhea, orthostatic intolerance, chronic fatigue, PMDD, seasonal allergies, gluten intolerance, and likely endometriosis.   Previously followed by Dr. Collene Mares - last seen 07/07/20 for diarrhea, C diff, gluten sensitivity identified in 2018 and diarrhea-predominant IBS diagnosed at age 20 not requiring therapy.  Treated for C diff with vancomycin by Dr. Collene Mares and ID 06/2020. Stool study at that time also positive for norovirus. Colonoscopy recommended at age 75. Prescribed Bentyl by Dr. Doreene Nest.  Dr. Collene Mares diagnosed her with GERD. Feels chest pain, palpitations, early satiety. Now with heartburn and trouble sleeping.  Tight twisty abdominal pain.  IBS had been treated with avoiding gluten. Follows a vegan diet. No longer drinks a lot of alcohol. Now rarely drinks a glass of wine twice weekly.  Celiac serologies have been negative over the years.   At the time of her consultation with me 11/11/20 she felt that her "gut has been disrupted" following the C diff infection earlier this year. She is now having frequent soft stools. Will have 2-5 BM daily.   Endoscopic evaluation performed 12/11/20:  - EGD 12/01/2020 showed gastritis.  Esophageal biopsies showed focal intraepithelial eosinophils up to 11 per high-powered field in the mid and proximal esophagus, distal esophageal biopsies were consistent with reflux, gastric biopsies showed chronic gastritis without H. pylori, and duodenal biopsies were normal. - Colonoscopy 12/01/2020 showed distal sigmoid erythema and was otherwise normal.  Colon and terminal ileal biopsies were normal.  At time of last office visit 07/30/21. She returns today after compliance with several months of pantoprazole therapy. She felt the PPI therpay helped with the reflux but worsened her abdominal pain. She is concerned that she might have low acid. She will be fine for several weeks but then finds the symptoms returned.  She returns in follow-up 03/14/22. She has raised the head of her bed and is avoiding eating at night to avoid heartburn. Using Pepcid PRN. She finds that she feels better if she doesn't eat. Any food will result in brain fog and nausea. Lower GI symptoms improve with avoidance of high fiber foods. But, reintroducing these foods worsens her upper GI tract symptoms. Bowel habits are swinging back and forth but she will have times that she finds that she empties out completely. She will use Imodium  PRN. Feels that she is stuck in a cycle. Cannot tolerate raw vegetables. Feels better when she is eating less healthy foods - uses traveling as an example.  She has added back gluten and  fruit juices without a worsening in symptoms.  Alcohol can actually improve her symptoms and she wonders if this is ultimately related to anxiety.   Saw an allergist in [place] 6-7 years ago.   Seen in follow-up 03/14/22 after completing 14 days of doxycyline. She was feeling better while on treatment. She continues to have abdominal pain and urgency. If she eats small portions she can control her symptoms.  Pain feels like knives. Reflux is well controlled. She will have intermittent chest pain. Will have palpitations if she eats a large meal.   Doesn't like to take medications. Pain will disappear before she can even take the Bentyl.  Using significant Imodium for diarrhea. Frequently has 5-7 bowel movements daily. FODMAP elimination did not identify any specific foods. She really finds that the food triggers.   Prior abdominal imaging:  - CT abd/pelvis with contrast 10/26/17 for LLQ pain and upper abdominal pain: no acute findings   Labs 06/26/20: normal CBC. Normal CMP.  Normal TSH 2021.   Prior endoscopic evaluation: - EGD 12/01/2020 showed gastritis.  Esophageal biopsies showed focal intraepithelial eosinophils up to 11 per high-powered field in the mid and proximal esophagus, distal esophageal biopsies were consistent with reflux, gastric biopsies showed chronic gastritis without H. pylori, and duodenal biopsies were normal. - Colonoscopy 12/01/2020 showed distal sigmoid erythema and was otherwise normal.  Colon and terminal ileal biopsies were normal.   Family history: Paternal aunt with ovarian cancer. Father with long standing history of GERD complicated by Barrett's Esophagus. No known family history of colon cancer or polyps. No other known family history of uterine/endometrial cancer, pancreatic cancer or gastric/stomach cancer.     Past Medical History:  Diagnosis Date   Anxiety    Benign joint hypermobility    Depression    Ehlers-Danlos syndrome    Endometriosis     Fibromyalgia    GERD (gastroesophageal reflux disease)    IBS (irritable bowel syndrome)    Thyroid disease     History reviewed. No pertinent surgical history.   Allergies as of 03/14/2022   (No Known Allergies)    Family History  Problem Relation Age of Onset   COPD Mother    Heart attack Father 3   Heart disease Maternal Grandfather    Heart attack Maternal Grandfather    Hypertension Paternal Grandfather    Ovarian cancer Paternal Aunt    Colon cancer Neg Hx    Stomach cancer Neg Hx    Esophageal cancer Neg Hx    Pancreatic cancer Neg Hx       Physical Exam: General:   Alert,  well-nourished, pleasant and cooperative in NAD Head:  Normocephalic and atraumatic. Eyes:  Sclera clear, no icterus.   Conjunctiva pink. Abdomen:  Soft, nontender, nondistended, normal bowel sounds, no rebound or guarding. No hepatosplenomegaly.   Neurologic:  Alert and  oriented x4;  grossly nonfocal Skin:  Intact without significant lesions or rashes. Psych:  Alert and cooperative. Normal mood and affect.     Dewayne Severe L. Tarri Glenn, MD, MPH 03/27/2022, 8:29 PM

## 2022-03-14 NOTE — Patient Instructions (Addendum)
It was a pleasure to see you today.   We discussed a breath test looking for bacterial overgrowth. Please let me know when you mail it.   I also prescribed another round of doxycycline that you can use as needed.   We discussed considering a gastric emptying study as well.   We discussed FDGuard as an option for additional symptoms control (samples provided).  We discussed SSRI as a strategy for long term symptom management.

## 2022-03-27 ENCOUNTER — Encounter: Payer: Self-pay | Admitting: Gastroenterology

## 2022-06-01 ENCOUNTER — Other Ambulatory Visit (HOSPITAL_COMMUNITY): Payer: Self-pay

## 2022-06-01 MED ORDER — ETONOGESTREL-ETHINYL ESTRADIOL 0.12-0.015 MG/24HR VA RING
1.0000 | VAGINAL_RING | VAGINAL | 2 refills | Status: DC
  Filled 2022-06-01: qty 3, 84d supply, fill #0
  Filled 2022-06-11: qty 3, 63d supply, fill #0
  Filled 2022-08-10: qty 3, 63d supply, fill #1
  Filled 2022-10-16: qty 3, 63d supply, fill #2

## 2022-06-13 ENCOUNTER — Other Ambulatory Visit (HOSPITAL_COMMUNITY): Payer: Self-pay

## 2022-06-14 ENCOUNTER — Other Ambulatory Visit (HOSPITAL_COMMUNITY): Payer: Self-pay

## 2022-07-29 ENCOUNTER — Other Ambulatory Visit (HOSPITAL_COMMUNITY): Payer: Self-pay

## 2022-07-29 DIAGNOSIS — M797 Fibromyalgia: Secondary | ICD-10-CM | POA: Diagnosis not present

## 2022-07-29 DIAGNOSIS — M2550 Pain in unspecified joint: Secondary | ICD-10-CM | POA: Diagnosis not present

## 2022-07-29 DIAGNOSIS — E559 Vitamin D deficiency, unspecified: Secondary | ICD-10-CM | POA: Diagnosis not present

## 2022-07-29 MED ORDER — HYDROXYZINE HCL 10 MG PO TABS
10.0000 mg | ORAL_TABLET | Freq: Three times a day (TID) | ORAL | 2 refills | Status: DC
  Filled 2022-07-29: qty 30, 10d supply, fill #0

## 2022-07-29 MED ORDER — ESCITALOPRAM OXALATE 5 MG PO TABS
5.0000 mg | ORAL_TABLET | Freq: Every day | ORAL | 2 refills | Status: DC
  Filled 2022-07-29: qty 30, 30d supply, fill #0

## 2022-08-02 ENCOUNTER — Ambulatory Visit: Payer: No Typology Code available for payment source | Admitting: Gastroenterology

## 2022-10-03 ENCOUNTER — Other Ambulatory Visit (HOSPITAL_COMMUNITY): Payer: Self-pay

## 2022-10-03 MED ORDER — FREESTYLE LIBRE 3 SENSOR MISC
1 refills | Status: DC
  Filled 2022-10-03: qty 2, 28d supply, fill #0
  Filled 2022-10-29: qty 2, 28d supply, fill #1

## 2022-10-17 DIAGNOSIS — L821 Other seborrheic keratosis: Secondary | ICD-10-CM | POA: Diagnosis not present

## 2022-10-17 DIAGNOSIS — L578 Other skin changes due to chronic exposure to nonionizing radiation: Secondary | ICD-10-CM | POA: Diagnosis not present

## 2022-10-17 DIAGNOSIS — D1801 Hemangioma of skin and subcutaneous tissue: Secondary | ICD-10-CM | POA: Diagnosis not present

## 2022-10-17 DIAGNOSIS — D229 Melanocytic nevi, unspecified: Secondary | ICD-10-CM | POA: Diagnosis not present

## 2022-10-17 DIAGNOSIS — L814 Other melanin hyperpigmentation: Secondary | ICD-10-CM | POA: Diagnosis not present

## 2022-10-18 ENCOUNTER — Other Ambulatory Visit (HOSPITAL_COMMUNITY): Payer: Self-pay

## 2022-10-26 DIAGNOSIS — A048 Other specified bacterial intestinal infections: Secondary | ICD-10-CM | POA: Insufficient documentation

## 2022-10-26 HISTORY — DX: Other specified bacterial intestinal infections: A04.8

## 2022-10-31 ENCOUNTER — Other Ambulatory Visit (HOSPITAL_COMMUNITY): Payer: Self-pay

## 2022-10-31 ENCOUNTER — Other Ambulatory Visit: Payer: Self-pay

## 2022-11-06 ENCOUNTER — Telehealth: Payer: Commercial Managed Care - PPO | Admitting: Nurse Practitioner

## 2022-11-06 DIAGNOSIS — M940 Chondrocostal junction syndrome [Tietze]: Secondary | ICD-10-CM | POA: Diagnosis not present

## 2022-11-06 NOTE — Patient Instructions (Signed)
Sharon Taylor, thank you for joining Sharon Rigg, NP for today's virtual visit.  While this provider is not your primary care provider (PCP), if your PCP is located in our provider database this encounter information will be shared with them immediately following your visit.   [name] account gives you access to today's visit and all your visits, tests, and labs performed at [facility] " click here if you don't have a [facility] MyChart account or go to [email or web address]  Consent: (Patient) Sharon Taylor provided verbal consent for this virtual visit at the beginning of the encounter.  Current Medications:  Current Outpatient Medications:    bismuth subsalicylate (PEPTO BISMOL) 262 MG/15ML suspension, Take 30 mLs by mouth as needed., Disp: , Rfl:    carboxymethylcellul-glycerin (REFRESH OPTIVE) 0.5-0.9 % ophthalmic solution, Place 1 drop into both eyes 2 (two) times daily., Disp: , Rfl:    cetirizine (ZYRTEC) 10 MG tablet, Take 10 mg by mouth daily., Disp: , Rfl:    cholecalciferol (VITAMIN D) 1000 units tablet, Take 2,000 Units by mouth daily., Disp: , Rfl:    Continuous Glucose Sensor (FREESTYLE LIBRE 3 SENSOR) MISC, Apply sensor to back of arm every 14 days, Disp: 2 each, Rfl: 1   escitalopram (LEXAPRO) 5 MG tablet, Take 1 tablet (5 mg total) by mouth daily., Disp: 30 tablet, Rfl: 2   etonogestrel-ethinyl estradiol (NUVARING) 0.12-0.015 MG/24HR vaginal ring, Place 1 ring vaginally every 3 weeks, Disp: 3 each, Rfl: 2   famotidine (PEPCID) 20 MG tablet, Take 20 mg by mouth as needed for heartburn or indigestion., Disp: , Rfl:    hydrOXYzine (ATARAX) 10 MG tablet, Take one tablet (10 mg dose) by mouth 3 (three) times a day as needed for Itching or Anxiety for up to 10 days., Disp: 30 tablet, Rfl: 2   Methylcobalamin (B12-ACTIVE PO), Take 2,500 mcg by mouth 2 (two) times a week., Disp: , Rfl:    NON FORMULARY, Take 5-10 mg by mouth as  needed (for anxiety). CBD Oil ([name]), Disp: , Rfl:    NUVARING 0.12-0.015 MG/24HR vaginal ring, Place 1 each vaginally every 28 (twenty-eight) days. , Disp: , Rfl: 1   propranolol (INDERAL) 10 MG tablet, Take 1 tablet (10 mg total) by mouth daily as needed (RAPID HEARTRATE)., Disp: 30 tablet, Rfl: 1  Current Facility-Administered Medications:    0.9 %  sodium chloride infusion, 500 mL, Intravenous, Once, Sharon Danas, MD   Medications ordered in this encounter:  No orders of the defined types were placed in this encounter.    *If you need refills on other medications prior to your next appointment, please contact your pharmacy*  Follow-Up: Call back or seek an in-person evaluation if the symptoms worsen or if the condition fails to improve as anticipated.  [facility] [phone]  Other Instructions May take tylenol, ibuprofen and or muscle relaxant for pain If pain worsens 10/10 or becomes constant, chest pain develops, you feel lightheaded or dizzy, or radiates sharply to back you need to be seen urgently.    If you have been instructed to have an in-person evaluation today at a local Urgent Care facility, please use the link below. It will take you to a list of all of our available [facility], including address, phone number and hours of operation. Please do not delay care.   Urgent Cares  If you or a family member do not have a primary care provider, use  the link below to schedule a visit and establish care. When you choose a [facility] primary care physician or advanced practice provider, you gain a long-term partner in health. Find a Primary Care Provider  Learn more about Sharon Taylor's in-office and virtual care options: [facility]

## 2022-11-06 NOTE — Progress Notes (Signed)
Virtual Visit Consent   Avo Schlachter, you are scheduled for a virtual visit with a [facility] provider today. Just as with appointments in the office, your consent must be obtained to participate. Your consent will be active for this visit and any virtual visit you may have with one of our providers in the next 365 days. If you have a MyChart account, a copy of this consent can be sent to you electronically.  As this is a virtual visit, video technology does not allow for your provider to perform a traditional examination. This may limit your provider's ability to fully assess your condition. If your provider identifies any concerns that need to be evaluated in person or the need to arrange testing (such as labs, EKG, etc.), we will make arrangements to do so. Although advances in technology are sophisticated, we cannot ensure that it will always work on either your end or our end. If the connection with a video visit is poor, the visit may have to be switched to a telephone visit. With either a video or telephone visit, we are not always able to ensure that we have a secure connection.  By engaging in this virtual visit, you consent to the provision of healthcare and authorize for your insurance to be billed (if applicable) for the services provided during this visit. Depending on your insurance coverage, you may receive a charge related to this service.  I need to obtain your verbal consent now. Are you willing to proceed with your visit today? Sharon Taylor has provided verbal consent on 11/06/2022 for a virtual visit (video or telephone). Claiborne Rigg, NP  Date: 11/06/2022 12:14 PM  Virtual Visit via Video Note   I, Claiborne Rigg, connected with  Sharon Taylor  ([id number], 08-06-1980) on 11/06/22 at 11:45 AM EDT by a video-enabled telemedicine application and verified that I am speaking with the correct person using two identifiers.  Location: Patient: Virtual Visit  Location Patient: Home Provider: Virtual Visit Location Provider: Home Office   I discussed the limitations of evaluation and management by telemedicine and the availability of in person appointments. The patient expressed understanding and agreed to proceed.    History of Present Illness: Sharon Taylor is a 42 y.o. who identifies as a female who was assigned female at birth, and is being seen today for left sided rib cage pain.  Sharon Taylor has complaints today of left sided rib cage pain which she states came on suddenly. Pain currently 1/10 and described as a stabbing sensation. When she attempts to take a deep cleansing breath pain is 9/10. Pain is positional and not constant. Feels like it radiates into her upper left clavicle/shoulder. Denies chest or back pain, syncope, N/V. She does have a history of chronic GI issues and fibromyalgia. She has muscle relaxants at home but has not taken yet   Problems:  Patient Active Problem List   Diagnosis Date Noted   Ankle sprain 05/15/2018   Hypotension 04/24/2018   Palpitations 04/24/2018   Anxiety and depression 07/27/2017   Atypical nevi 07/27/2017   IBS (irritable bowel syndrome) 07/27/2017   Subclinical hypothyroidism 07/27/2017   Tachycardia 07/27/2017   Fibromyalgia 07/26/2017    Allergies: No Known Allergies Medications:  Current Outpatient Medications:    bismuth subsalicylate (PEPTO BISMOL) 262 MG/15ML suspension, Take 30 mLs by mouth as needed., Disp: , Rfl:    carboxymethylcellul-glycerin (REFRESH OPTIVE) 0.5-0.9 % ophthalmic solution, Place 1 drop into both eyes 2 (two) times  daily., Disp: , Rfl:    cetirizine (ZYRTEC) 10 MG tablet, Take 10 mg by mouth daily., Disp: , Rfl:    cholecalciferol (VITAMIN D) 1000 units tablet, Take 2,000 Units by mouth daily., Disp: , Rfl:    Continuous Glucose Sensor (FREESTYLE LIBRE 3 SENSOR) MISC, Apply sensor to back of arm every 14 days, Disp: 2 each, Rfl: 1   escitalopram  (LEXAPRO) 5 MG tablet, Take 1 tablet (5 mg total) by mouth daily., Disp: 30 tablet, Rfl: 2   etonogestrel-ethinyl estradiol (NUVARING) 0.12-0.015 MG/24HR vaginal ring, Place 1 ring vaginally every 3 weeks, Disp: 3 each, Rfl: 2   famotidine (PEPCID) 20 MG tablet, Take 20 mg by mouth as needed for heartburn or indigestion., Disp: , Rfl:    hydrOXYzine (ATARAX) 10 MG tablet, Take one tablet (10 mg dose) by mouth 3 (three) times a day as needed for Itching or Anxiety for up to 10 days., Disp: 30 tablet, Rfl: 2   Methylcobalamin (B12-ACTIVE PO), Take 2,500 mcg by mouth 2 (two) times a week., Disp: , Rfl:    NON FORMULARY, Take 5-10 mg by mouth as needed (for anxiety). CBD Oil ([name]), Disp: , Rfl:    NUVARING 0.12-0.015 MG/24HR vaginal ring, Place 1 each vaginally every 28 (twenty-eight) days. , Disp: , Rfl: 1   propranolol (INDERAL) 10 MG tablet, Take 1 tablet (10 mg total) by mouth daily as needed (RAPID HEARTRATE)., Disp: 30 tablet, Rfl: 1  Current Facility-Administered Medications:    0.9 %  sodium chloride infusion, 500 mL, Intravenous, Once, Tressia Danas, MD  Observations/Objective: Patient is well-developed, well-nourished in no acute distress.  Resting comfortably at home.  Head is normocephalic, atraumatic.  No labored breathing.  Speech is clear and coherent with logical content.  Patient is alert and oriented at baseline.    Assessment and Plan: 1. Costochondritis May take tylenol, ibuprofen and or muscle relaxant for pain If pain worsens 10/10 or becomes constant, chest pain develops, you feel lightheaded or dizzy, or radiates sharply to back you need to be seen urgently.    Follow Up Instructions: I discussed the assessment and treatment plan with the patient. The patient was provided an opportunity to ask questions and all were answered. The patient agreed with the plan and demonstrated an understanding of the instructions.  A copy of instructions were sent to the  patient via MyChart unless otherwise noted below.    The patient was advised to call back or seek an in-person evaluation if the symptoms worsen or if the condition fails to improve as anticipated.  Time:  I spent 12 minutes with the patient via telehealth technology discussing the above problems/concerns.    Claiborne Rigg, NP

## 2022-11-11 ENCOUNTER — Encounter: Payer: Self-pay | Admitting: Family Medicine

## 2022-11-11 ENCOUNTER — Ambulatory Visit: Payer: Commercial Managed Care - PPO | Admitting: Family Medicine

## 2022-11-11 VITALS — BP 108/66 | HR 76 | Temp 97.6°F | Ht 69.0 in | Wt 151.0 lb

## 2022-11-11 DIAGNOSIS — F32A Depression, unspecified: Secondary | ICD-10-CM

## 2022-11-11 DIAGNOSIS — L508 Other urticaria: Secondary | ICD-10-CM | POA: Diagnosis not present

## 2022-11-11 DIAGNOSIS — F419 Anxiety disorder, unspecified: Secondary | ICD-10-CM

## 2022-11-11 DIAGNOSIS — Z91030 Bee allergy status: Secondary | ICD-10-CM | POA: Diagnosis not present

## 2022-11-11 DIAGNOSIS — K582 Mixed irritable bowel syndrome: Secondary | ICD-10-CM | POA: Diagnosis not present

## 2022-11-11 DIAGNOSIS — M797 Fibromyalgia: Secondary | ICD-10-CM

## 2022-11-11 DIAGNOSIS — I7300 Raynaud's syndrome without gangrene: Secondary | ICD-10-CM | POA: Insufficient documentation

## 2022-11-11 DIAGNOSIS — E038 Other specified hypothyroidism: Secondary | ICD-10-CM

## 2022-11-11 DIAGNOSIS — K219 Gastro-esophageal reflux disease without esophagitis: Secondary | ICD-10-CM

## 2022-11-11 DIAGNOSIS — H5213 Myopia, bilateral: Secondary | ICD-10-CM | POA: Diagnosis not present

## 2022-11-11 DIAGNOSIS — I959 Hypotension, unspecified: Secondary | ICD-10-CM

## 2022-11-11 NOTE — Patient Instructions (Signed)
Thank you for trusting [place] with your health care.  I placed a referral to [facility] gastroenterology.  They will call you to schedule a visit.  Let me know if you need a referral to an allergist.  Dr. [place] Callas would be a good choice.

## 2022-11-11 NOTE — Progress Notes (Signed)
New Patient Office Visit  Subjective    Patient ID: Sharon Taylor, female    DOB: 09-29-1980  Age: 42 y.o. MRN: [id number]  CC:  Chief Complaint  Patient presents with   Establish Care    Just wants to discuss where things, may need referral for allergist     HPI Sharon Taylor presents to establish care.  Here with her wife today. Previous medical care- [facility]  Other providers: [facility]- Dr. Orvan Falconer who retired and needs to establish with a new GI. OB/GYN- Dr. Estanislado Pandy at [facility]  Rheumatologist (multiple ones) reports negative workup for autoimmune conditions.  Diagnosis fibromyalgia with atypical symptoms. Dr. Derrell Lolling at [facility]   She would like to see an allergist. Hives intermittent for years, itching and watery eyes.  Hx of allergies and allergy related asthma.  Takes Zyrtec Zyrtec  Worsening reaction to bee stings, has EpiPen  Other PMH:  Scoliosis and joint instability   Temperature regulation issues   Hypotension   IBS- GI bacterial mapping with functional medicine  Previously diarrhea and now with constipation.   GERD- takes Pepcid   Subclinical hypothyroidism   PMDD- ?endometriosis - Nuvaring helps  Severe pelvic pain and depression monthly   Hx of anxiety and depression- stable.    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   carboxymethylcellul-glycerin (REFRESH OPTIVE) 0.5-0.9 % ophthalmic solution Place 1 drop into both eyes 2 (two) times daily.   cetirizine (ZYRTEC) 10 MG tablet Take 10 mg by mouth daily.   cholecalciferol (VITAMIN D) 1000 units tablet Take 2,000 Units by mouth daily.   Continuous Glucose Sensor (FREESTYLE LIBRE 3 SENSOR) MISC Apply sensor to back of arm every 14 days    etonogestrel-ethinyl estradiol (NUVARING) 0.12-0.015 MG/24HR vaginal ring Place 1 ring vaginally every 3 weeks   famotidine (PEPCID) 20 MG tablet Take 20 mg by mouth as needed for heartburn or indigestion.   Methylcobalamin (B12-ACTIVE PO) Take 2,500 mcg by mouth 2 (two) times a week.   N-ACETYL CYSTEINE PO    NON FORMULARY Take 5-10 mg by mouth as needed (for anxiety). CBD Oil ([name])   NUVARING 0.12-0.015 MG/24HR vaginal ring Place 1 each vaginally every 28 (twenty-eight) days.    loperamide (IMODIUM A-D) 2 MG tablet    MOLYBDENUM PO    Quercetin 250 MG TABS    [DISCONTINUED] escitalopram (LEXAPRO) 5 MG tablet Take 1 tablet (5 mg total) by mouth daily.   [DISCONTINUED] hydrOXYzine (ATARAX) 10 MG tablet Take one tablet (10 mg dose) by mouth 3 (three) times a day as needed for Itching or Anxiety for up to 10 days.   [DISCONTINUED] propranolol (INDERAL) 10 MG tablet Take 1 tablet (10 mg total) by mouth daily as needed (RAPID HEARTRATE).   Facility-Administered Encounter Medications as of 11/11/2022  Medication   0.9 %  sodium chloride infusion    Past Medical History:  Diagnosis Date   Allergy Birth   Anxiety    Asthma    Allergy related   Benign joint hypermobility    Depression    Ehlers-Danlos syndrome    Endometriosis    Fibromyalgia    GERD (gastroesophageal reflux disease)    IBS (irritable bowel syndrome)    Thyroid disease     History reviewed. No pertinent  surgical history.  Family History  Problem Relation Age of Onset   COPD Mother    Heart attack Father 44   Depression Father    Hyperlipidemia Father    ADD / ADHD Sister    Alcohol abuse Sister    Heart disease Maternal Grandfather    Heart attack Maternal Grandfather    Alcohol abuse Maternal Grandfather    Hypertension Maternal Grandfather    Hypertension Paternal Grandfather    Ovarian cancer Paternal Aunt    Alcohol abuse Maternal Grandmother    Alcohol abuse Maternal Uncle    Alcohol abuse  Maternal Uncle    Colon cancer Neg Hx    Stomach cancer Neg Hx    Esophageal cancer Neg Hx    Pancreatic cancer Neg Hx     Social History   Socioeconomic History   Marital status: Married    Spouse name: Not on file   Number of children: Not on file   Years of education: Not on file   Highest education level: Not on file  Occupational History   Not on file  Tobacco Use   Smoking status: Never   Smokeless tobacco: Never  Vaping Use   Vaping Use: Never used  Substance and Sexual Activity   Alcohol use: Yes    Alcohol/week: 2.0 standard drinks of alcohol    Types: 1 Glasses of wine, 1 Shots of liquor per week    Comment: 0-2 drinks per week, many weeks none   Drug use: Not Currently   Sexual activity: Yes    Comment: Same sex relationship  Other Topics Concern   Not on file  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    Social Determinants of Health   Financial Resource Strain: Not on file  Food Insecurity: Not on file  Transportation Needs: Not on file  Physical Activity: Not on file  Stress: Not on file  Social Connections: Not on file  Intimate Partner Violence: Not on file    Review of Systems  Constitutional:  Negative for chills and fever.  Respiratory:  Negative for shortness of breath.   Cardiovascular:  Negative for chest pain and palpitations.  Gastrointestinal:  Negative for abdominal pain, constipation, diarrhea, nausea and vomiting.  Genitourinary:  Negative for dysuria, frequency and urgency.  Neurological:  Negative for dizziness.        Objective    BP 108/66 (BP Location: Left Arm, Patient Position: Sitting, Cuff Size: Large)   Pulse 76   Temp 97.6 F (36.4 C) (Temporal)   Ht 5\' 9"  (1.753 m)   Wt 151 lb (68.5 kg)   SpO2 98%   BMI 22.30 kg/m   Physical Exam Constitutional:      General: She is not in acute distress.    Appearance: She is not ill-appearing.  Eyes:     Extraocular Movements: Extraocular movements intact.      Conjunctiva/sclera: Conjunctivae normal.  Cardiovascular:     Rate and Rhythm: Normal rate.  Pulmonary:     Effort: Pulmonary effort is normal.  Musculoskeletal:     Cervical back: Normal range of motion and neck supple.  Skin:    General: Skin is warm and dry.  Neurological:     General: No focal deficit present.     Mental Status: She is alert and oriented to person, place, and time.  Psychiatric:        Mood and Affect: Mood normal.        Behavior: Behavior  normal.        Thought Content: Thought content normal.         Assessment & Plan:   Problem List Items Addressed This Visit       Cardiovascular and Mediastinum   Hypotension     Digestive   IBS (irritable bowel syndrome) - Primary   Relevant Medications   loperamide (IMODIUM A-D) 2 MG tablet   Other Relevant Orders   Ambulatory referral to Gastroenterology     Endocrine   Subclinical hypothyroidism     Other   Anxiety and depression   Bee sting allergy   Fibromyalgia   Other Visit Diagnoses     Gastroesophageal reflux disease, unspecified whether esophagitis present       Relevant Medications   loperamide (IMODIUM A-D) 2 MG tablet   Other Relevant Orders   Ambulatory referral to Gastroenterology   Urticaria, chronic          She is a pleasant 42 year old female who is new to the practice and here to establish care.  We spent time reviewing her past medical history today.  She is under the care of several specialists.  She is also seeing a functional medicine doctor.  I will refer her back to [facility] for chronic GI symptoms. Discussed scheduling with Dr. [place] Callas, allergy specialist. I am happy to place a referral if needed.  No refills needed.  She will follow up here as needed.   Visit time 20 minutes in face to face communication with patient and coordination of care, additional 10 minutes spent in record review, coordination or care, ordering tests, communicating/referring to other healthcare  professionals, documenting in medical records all on the same day of the visit for total time 30 minutes spent on the visit.    Return if symptoms worsen or fail to improve.   Hetty Blend, NP-C

## 2022-11-23 ENCOUNTER — Other Ambulatory Visit (HOSPITAL_COMMUNITY): Payer: Self-pay

## 2022-11-23 MED ORDER — FREESTYLE LIBRE 3 SENSOR MISC
1 refills | Status: DC
  Filled 2022-11-23: qty 2, 28d supply, fill #0
  Filled 2022-12-21: qty 2, 28d supply, fill #1

## 2022-12-13 ENCOUNTER — Other Ambulatory Visit (HOSPITAL_COMMUNITY): Payer: Self-pay

## 2022-12-14 ENCOUNTER — Other Ambulatory Visit (HOSPITAL_COMMUNITY): Payer: Self-pay

## 2022-12-14 MED ORDER — ETONOGESTREL-ETHINYL ESTRADIOL 0.12-0.015 MG/24HR VA RING
VAGINAL_RING | VAGINAL | 2 refills | Status: AC
  Filled 2022-12-14: qty 3, 84d supply, fill #0
  Filled 2023-02-20: qty 3, 84d supply, fill #1
  Filled 2023-07-25: qty 3, 63d supply, fill #1
  Filled 2023-09-27: qty 3, 63d supply, fill #2

## 2023-01-06 ENCOUNTER — Encounter: Payer: Commercial Managed Care - PPO | Admitting: Family Medicine

## 2023-02-20 ENCOUNTER — Other Ambulatory Visit (HOSPITAL_COMMUNITY): Payer: Self-pay

## 2023-02-22 ENCOUNTER — Other Ambulatory Visit: Payer: Self-pay

## 2023-02-28 ENCOUNTER — Other Ambulatory Visit (HOSPITAL_COMMUNITY): Payer: Self-pay

## 2023-02-28 DIAGNOSIS — Z1330 Encounter for screening examination for mental health and behavioral disorders, unspecified: Secondary | ICD-10-CM | POA: Diagnosis not present

## 2023-02-28 DIAGNOSIS — Z01419 Encounter for gynecological examination (general) (routine) without abnormal findings: Secondary | ICD-10-CM | POA: Diagnosis not present

## 2023-02-28 DIAGNOSIS — Z3040 Encounter for surveillance of contraceptives, unspecified: Secondary | ICD-10-CM | POA: Diagnosis not present

## 2023-02-28 DIAGNOSIS — Z1231 Encounter for screening mammogram for malignant neoplasm of breast: Secondary | ICD-10-CM | POA: Diagnosis not present

## 2023-02-28 DIAGNOSIS — Z6821 Body mass index (BMI) 21.0-21.9, adult: Secondary | ICD-10-CM | POA: Diagnosis not present

## 2023-02-28 MED ORDER — ETONOGESTREL-ETHINYL ESTRADIOL 0.12-0.015 MG/24HR VA RING
VAGINAL_RING | VAGINAL | 2 refills | Status: AC
  Filled 2023-02-28: qty 3, 84d supply, fill #0
  Filled 2023-05-12: qty 3, 63d supply, fill #1
  Filled 2023-07-20 – 2023-07-21 (×2): qty 3, 84d supply, fill #2
  Filled 2023-08-01: qty 3, 63d supply, fill #2

## 2023-03-01 ENCOUNTER — Other Ambulatory Visit: Payer: Self-pay

## 2023-03-01 ENCOUNTER — Other Ambulatory Visit (HOSPITAL_COMMUNITY): Payer: Self-pay

## 2023-03-01 DIAGNOSIS — R21 Rash and other nonspecific skin eruption: Secondary | ICD-10-CM | POA: Diagnosis not present

## 2023-03-01 DIAGNOSIS — Z91030 Bee allergy status: Secondary | ICD-10-CM | POA: Diagnosis not present

## 2023-03-01 DIAGNOSIS — R052 Subacute cough: Secondary | ICD-10-CM | POA: Diagnosis not present

## 2023-03-01 DIAGNOSIS — J3089 Other allergic rhinitis: Secondary | ICD-10-CM | POA: Diagnosis not present

## 2023-03-01 MED ORDER — AEROCHAMBER HOLDING CHAMBER DEVI
1.0000 | 0 refills | Status: AC
  Filled 2023-03-01: qty 1, 30d supply, fill #0

## 2023-03-01 MED ORDER — ALBUTEROL SULFATE HFA 108 (90 BASE) MCG/ACT IN AERS
INHALATION_SPRAY | RESPIRATORY_TRACT | 0 refills | Status: DC
  Filled 2023-03-01: qty 6.7, 30d supply, fill #0

## 2023-03-01 MED ORDER — MOMETASONE FUROATE 0.1 % EX CREA
TOPICAL_CREAM | CUTANEOUS | 1 refills | Status: AC
  Filled 2023-03-01: qty 105, 30d supply, fill #0

## 2023-03-06 ENCOUNTER — Other Ambulatory Visit (HOSPITAL_COMMUNITY): Payer: Self-pay

## 2023-05-12 ENCOUNTER — Encounter (HOSPITAL_BASED_OUTPATIENT_CLINIC_OR_DEPARTMENT_OTHER): Payer: Self-pay | Admitting: Student

## 2023-05-12 ENCOUNTER — Telehealth: Payer: Commercial Managed Care - PPO

## 2023-05-12 ENCOUNTER — Ambulatory Visit (HOSPITAL_BASED_OUTPATIENT_CLINIC_OR_DEPARTMENT_OTHER): Payer: Commercial Managed Care - PPO

## 2023-05-12 ENCOUNTER — Other Ambulatory Visit (HOSPITAL_COMMUNITY): Payer: Self-pay

## 2023-05-12 ENCOUNTER — Ambulatory Visit (HOSPITAL_BASED_OUTPATIENT_CLINIC_OR_DEPARTMENT_OTHER): Payer: Commercial Managed Care - PPO | Admitting: Student

## 2023-05-12 DIAGNOSIS — M25571 Pain in right ankle and joints of right foot: Secondary | ICD-10-CM | POA: Diagnosis not present

## 2023-05-12 DIAGNOSIS — S93401A Sprain of unspecified ligament of right ankle, initial encounter: Secondary | ICD-10-CM | POA: Diagnosis not present

## 2023-05-12 DIAGNOSIS — M79671 Pain in right foot: Secondary | ICD-10-CM | POA: Diagnosis not present

## 2023-05-12 DIAGNOSIS — S99911A Unspecified injury of right ankle, initial encounter: Secondary | ICD-10-CM | POA: Diagnosis not present

## 2023-05-12 MED ORDER — ALBUTEROL SULFATE HFA 108 (90 BASE) MCG/ACT IN AERS
1.0000 | INHALATION_SPRAY | RESPIRATORY_TRACT | 0 refills | Status: AC | PRN
  Filled 2023-05-12: qty 6.7, 16d supply, fill #0

## 2023-05-12 NOTE — Progress Notes (Signed)
Chief Complaint: Right ankle injury     History of Present Illness:    Sharon Taylor is a 42 y.o. female here today for evaluation of her right ankle.  States that 2 days ago while walking her dogs she sustained an inversion injury.  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  States that pain today is located in the lateral ankle as well as the top and bottom of the foot.  Pain has improved slightly since the injury.  She has been taking ibuprofen and using ice as needed.   Surgical History:   None  PMH/PSH/Family History/Social History/Meds/Allergies:    Past Medical History:  Diagnosis Date   Allergy Birth   Anxiety    Asthma    Allergy related   Benign joint hypermobility    Depression    Ehlers-Danlos syndrome    Endometriosis    Fibromyalgia    GERD (gastroesophageal reflux disease)    IBS (irritable bowel syndrome)    Thyroid disease    History reviewed. No pertinent surgical history. Social History   Socioeconomic History   Marital status: Married    Spouse name: Not on file   Number of children: Not on file   Years of education: Not on file   Highest education level: Not on file  Occupational History   Not on file  Tobacco Use   Smoking status: Never   Smokeless tobacco: Never  Vaping Use   Vaping status: Never Used  Substance and Sexual Activity   Alcohol use: Yes    Alcohol/week: 2.0 standard drinks of alcohol    Types: 1 Glasses of wine, 1 Shots of liquor per week    Comment: 0-2 drinks per week, many weeks none   Drug use: Not Currently   Sexual activity: Yes    Comment: Same sex relationship  Other Topics Concern   Not on file  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    Social Drivers of Corporate investment banker Strain: Low Risk  (07/28/2022)   Received from [facility], [facility]   Overall Financial Resource Strain  (CARDIA)    Difficulty of Paying Living Expenses: Not hard at all  Food Insecurity: No Food Insecurity (07/28/2022)   Received from [facility], [facility]   Hunger Vital Sign    Worried About Running Out of Food in the Last Year: Never true    Ran Out of Food in the Last Year: Never true  Transportation Needs: No Transportation Needs (07/28/2022)   Received from [facility], [facility]   PRAPARE - Transportation    Lack of Transportation (Medical): No    Lack of Transportation (Non-Medical): No  Physical Activity: Insufficiently Active (07/28/2022)   Received from [facility], [facility]   Exercise Vital Sign    Days of Exercise per Week: 3 days    Minutes of Exercise per Session: 30 min  Stress: No Stress Concern Present (07/28/2022)   Received from [facility], [facility] - Occupational Stress Questionnaire    Feeling of Stress : Only a little  Social Connections: Moderately Integrated (07/28/2022)   Received from [facility], [facility]   Social Network    How would  you rate your social network (family, work, friends)?: Adequate participation with social networks   Family History  Problem Relation Age of Onset   COPD Mother    Heart attack Father 28   Depression Father    Hyperlipidemia Father    ADD / ADHD Sister    Alcohol abuse Sister    Heart disease Maternal Grandfather    Heart attack Maternal Grandfather    Alcohol abuse Maternal Grandfather    Hypertension Maternal Grandfather    Hypertension Paternal Grandfather    Ovarian cancer Paternal Aunt    Alcohol abuse Maternal Grandmother    Alcohol abuse Maternal Uncle    Alcohol abuse Maternal Uncle    Colon cancer Neg Hx    Stomach cancer Neg Hx    Esophageal cancer Neg Hx    Pancreatic cancer Neg Hx    No Known Allergies Current Outpatient Medications  Medication Sig Dispense Refill   albuterol (VENTOLIN HFA) 108 (90 Base) MCG/ACT inhaler Inhale  1-2 puffs into the lungs every 4-6 hours as needed for cough/wheeze 6.7 g 0   bismuth subsalicylate (PEPTO BISMOL) 262 MG/15ML suspension Take 30 mLs by mouth as needed.     carboxymethylcellul-glycerin (REFRESH OPTIVE) 0.5-0.9 % ophthalmic solution Place 1 drop into both eyes 2 (two) times daily.     cetirizine (ZYRTEC) 10 MG tablet Take 10 mg by mouth daily.     cholecalciferol (VITAMIN D) 1000 units tablet Take 2,000 Units by mouth daily.     Continuous Glucose Sensor (FREESTYLE LIBRE 3 SENSOR) MISC Apply 1 sensor to back of arm every 14 (fourteen) days. 2 each 1   etonogestrel-ethinyl estradiol (NUVARING) 0.12-0.015 MG/24HR vaginal ring Place 1 ring vaginally every 3 weeks 3 each 2   etonogestrel-ethinyl estradiol (NUVARING) 0.12-0.015 MG/24HR vaginal ring Place 1 ring vaginally every 3 weeks 3 each 2   famotidine (PEPCID) 20 MG tablet Take 20 mg by mouth as needed for heartburn or indigestion.     loperamide (IMODIUM A-D) 2 MG tablet      Methylcobalamin (B12-ACTIVE PO) Take 2,500 mcg by mouth 2 (two) times a week.     MOLYBDENUM PO      mometasone (ELOCON) 0.1 % cream Apply to affected area twice daily 100 g 1   N-ACETYL CYSTEINE PO      NON FORMULARY Take 5-10 mg by mouth as needed (for anxiety). CBD Oil ([name])     NUVARING 0.12-0.015 MG/24HR vaginal ring Place 1 each vaginally every 28 (twenty-eight) days.   1   Quercetin 250 MG TABS      Spacer/Aero-Holding Chambers (AEROCHAMBER HOLDING CHAMBER) DEVI use as directed 1 each 0   Current Facility-Administered Medications  Medication Dose Route Frequency Provider Last Rate Last Admin   0.9 %  sodium chloride infusion  500 mL Intravenous Once Tressia Danas, MD       No results found.  Review of Systems:   A ROS was performed including pertinent positives and negatives as documented in the HPI.  Physical Exam :   Constitutional: NAD and appears stated age Neurological: Alert and oriented Psych: Appropriate affect and  cooperative There were no vitals taken for this visit.   Comprehensive Musculoskeletal Exam:    Right ankle exam demonstrates mild soft tissue swelling laterally.  Active range of motion to 20 degrees dorsiflexion 50 degrees plantarflexion.  No tenderness over the medial or lateral malleolus.  Negative Thompson test.  Laxity noted with anterior drawer test.  Distal neurosensory exam  intact.  Dorsalis pedis 2+.  Imaging:   Xray (right ankle 3 views, right foot 3 views): Thin transverse lucency of the distal fibula however this does not extend into the cortex therefore indeterminant for acute fracture.  Otherwise no bony abnormality.   I personally reviewed and interpreted the radiographs.   Assessment:   42 y.o. female with acute right ankle pain after an inversion injury 2 days ago.  Patient reports history of hypermobility and therefore is at higher risk of ankle instability.  X-rays taken today do not show any convincing evidence of acute fracture therefore this does appear consistent with a lateral ankle sprain.  She is already wearing an ankle brace which I I recommend to use until pain has improved and she feels comfortable with weightbearing.  Can continue ice and ibuprofen as needed.  Will have her monitor for continued instability and if this persists I would likely consider referral to physical therapy for ankle strengthening.  Plan :    -Return to clinic as needed     I personally saw and evaluated the patient, and participated in the management and treatment plan.  Hazle Nordmann, PA-C Orthopedics

## 2023-05-15 ENCOUNTER — Other Ambulatory Visit (HOSPITAL_COMMUNITY): Payer: Self-pay

## 2023-05-18 ENCOUNTER — Other Ambulatory Visit: Payer: Self-pay

## 2023-05-19 ENCOUNTER — Other Ambulatory Visit (HOSPITAL_COMMUNITY): Payer: Self-pay

## 2023-05-31 ENCOUNTER — Other Ambulatory Visit: Payer: Self-pay | Admitting: Obstetrics and Gynecology

## 2023-05-31 DIAGNOSIS — N6310 Unspecified lump in the right breast, unspecified quadrant: Secondary | ICD-10-CM | POA: Diagnosis not present

## 2023-05-31 DIAGNOSIS — N644 Mastodynia: Secondary | ICD-10-CM | POA: Diagnosis not present

## 2023-06-23 ENCOUNTER — Ambulatory Visit
Admission: RE | Admit: 2023-06-23 | Discharge: 2023-06-23 | Disposition: A | Discharge status: Home / Self Care | Payer: Commercial Managed Care - PPO | Source: Ambulatory Visit | Attending: Obstetrics and Gynecology | Admitting: Obstetrics and Gynecology

## 2023-06-23 DIAGNOSIS — N6315 Unspecified lump in the right breast, overlapping quadrants: Secondary | ICD-10-CM | POA: Diagnosis not present

## 2023-06-23 DIAGNOSIS — N6001 Solitary cyst of right breast: Secondary | ICD-10-CM | POA: Diagnosis not present

## 2023-06-23 DIAGNOSIS — N6310 Unspecified lump in the right breast, unspecified quadrant: Secondary | ICD-10-CM

## 2023-07-13 ENCOUNTER — Encounter: Payer: Self-pay | Admitting: Family Medicine

## 2023-07-13 ENCOUNTER — Other Ambulatory Visit: Payer: Self-pay

## 2023-07-13 ENCOUNTER — Other Ambulatory Visit (HOSPITAL_COMMUNITY): Payer: Self-pay

## 2023-07-13 ENCOUNTER — Ambulatory Visit (INDEPENDENT_AMBULATORY_CARE_PROVIDER_SITE_OTHER): Payer: Commercial Managed Care - PPO | Admitting: Family Medicine

## 2023-07-13 VITALS — BP 112/78 | HR 81 | Temp 97.6°F | Ht 69.0 in | Wt 149.0 lb

## 2023-07-13 DIAGNOSIS — E162 Hypoglycemia, unspecified: Secondary | ICD-10-CM

## 2023-07-13 DIAGNOSIS — Z13220 Encounter for screening for lipoid disorders: Secondary | ICD-10-CM

## 2023-07-13 DIAGNOSIS — Z0000 Encounter for general adult medical examination without abnormal findings: Secondary | ICD-10-CM | POA: Diagnosis not present

## 2023-07-13 DIAGNOSIS — R739 Hyperglycemia, unspecified: Secondary | ICD-10-CM

## 2023-07-13 DIAGNOSIS — Z789 Other specified health status: Secondary | ICD-10-CM

## 2023-07-13 DIAGNOSIS — Z1159 Encounter for screening for other viral diseases: Secondary | ICD-10-CM | POA: Diagnosis not present

## 2023-07-13 DIAGNOSIS — Z9109 Other allergy status, other than to drugs and biological substances: Secondary | ICD-10-CM

## 2023-07-13 DIAGNOSIS — E038 Other specified hypothyroidism: Secondary | ICD-10-CM

## 2023-07-13 DIAGNOSIS — Z0001 Encounter for general adult medical examination with abnormal findings: Secondary | ICD-10-CM

## 2023-07-13 LAB — COMPREHENSIVE METABOLIC PANEL
ALT: 23 U/L (ref 0–35)
AST: 24 U/L (ref 0–37)
Albumin: 4.7 g/dL (ref 3.5–5.2)
Alkaline Phosphatase: 64 U/L (ref 39–117)
BUN: 10 mg/dL (ref 6–23)
CO2: 26 meq/L (ref 19–32)
Calcium: 9.5 mg/dL (ref 8.4–10.5)
Chloride: 101 meq/L (ref 96–112)
Creatinine, Ser: 0.8 mg/dL (ref 0.40–1.20)
GFR: 91.17 mL/min (ref >60.00)
Glucose, Bld: 90 mg/dL (ref 70–99)
Potassium: 3.9 meq/L (ref 3.5–5.1)
Sodium: 136 meq/L (ref 135–145)
Total Bilirubin: 0.7 mg/dL (ref 0.2–1.2)
Total Protein: 7.7 g/dL (ref 6.0–8.3)

## 2023-07-13 LAB — CBC WITH DIFFERENTIAL/PLATELET
Basophils Absolute: 0 10*3/uL (ref 0.0–0.1)
Basophils Relative: 0.6 % (ref 0.0–3.0)
Eosinophils Absolute: 0.1 10*3/uL (ref 0.0–0.7)
Eosinophils Relative: 0.8 % (ref 0.0–5.0)
HCT: 44.8 % (ref 36.0–46.0)
Hemoglobin: 14.9 g/dL (ref 12.0–15.0)
Lymphocytes Relative: 19.8 % (ref 12.0–46.0)
Lymphs Abs: 1.7 10*3/uL (ref 0.7–4.0)
MCHC: 33.2 g/dL (ref 30.0–36.0)
MCV: 95.2 fL (ref 78.0–100.0)
Monocytes Absolute: 0.5 10*3/uL (ref 0.1–1.0)
Monocytes Relative: 5.5 % (ref 3.0–12.0)
Neutro Abs: 6.2 10*3/uL (ref 1.4–7.7)
Neutrophils Relative %: 73.3 % (ref 43.0–77.0)
Platelets: 273 10*3/uL (ref 150.0–400.0)
RBC: 4.7 Mil/uL (ref 3.87–5.11)
RDW: 12.7 % (ref 11.5–15.5)
WBC: 8.4 10*3/uL (ref 4.0–10.5)

## 2023-07-13 LAB — LIPID PANEL
Cholesterol: 171 mg/dL (ref 0–200)
HDL: 88.6 mg/dL (ref >39.00)
LDL Cholesterol: 65 mg/dL (ref 0–99)
NonHDL: 82.28
Total CHOL/HDL Ratio: 2
Triglycerides: 87 mg/dL (ref 0.0–149.0)
VLDL: 17.4 mg/dL (ref 0.0–40.0)

## 2023-07-13 LAB — FOLATE: Folate: >25.2 ng/mL (ref >5.9)

## 2023-07-13 LAB — FERRITIN: Ferritin: 34.2 ng/mL (ref 10.0–291.0)

## 2023-07-13 LAB — VITAMIN B12: Vitamin B-12: 665 pg/mL (ref 211–911)

## 2023-07-13 LAB — TSH: TSH: 1.52 u[IU]/mL (ref 0.35–5.50)

## 2023-07-13 LAB — HEMOGLOBIN A1C: Hgb A1c MFr Bld: 5.1 % (ref 4.6–6.5)

## 2023-07-13 LAB — T4, FREE: Free T4: 0.81 ng/dL (ref 0.60–1.60)

## 2023-07-13 NOTE — Progress Notes (Signed)
 Complete physical exam  Patient: Sharon Taylor   DOB: 03/14/1981   43 y.o. Female  MRN: [id number]  Subjective:    Chief Complaint  Patient presents with   Annual Exam   She is here for a complete physical exam.  Other providers:  GI- Dr. Orvan Falconer who retired and needs to establish with a new GI. OB/GYN- Dr. Estanislado Pandy at [facility]  Rheumatologist (multiple ones) reports negative workup for autoimmune conditions.  Diagnosis fibromyalgia with atypical symptoms. Dr. Derrell Lolling at [facility]  Allergist - Dr. Sentinel Callas    Hx of anxiety and depression- stable  IBS- seeing Dr. Derrell Lolling for this   States she wore a CGM to see what her BS have been and she had some readings >200 and some in the 50s. No hx of DM.    States she had allergy testing with Dr. Cordova Callas and she has a lot of allergies.  Underlying ?asthma. 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 The date shown is not the original due date.    Wears seatbelt always, uses sunscreen, smoke detectors in home and functioning, does not text while driving, feels safe in home environment.  Depression screening:    07/13/2023    1:40 PM 11/11/2022    9:43 AM  Depression screen PHQ 2/9  Decreased Interest 0 0  Down, Depressed, Hopeless 0 0  PHQ - 2 Score 0 0   Anxiety Screening:     No data to display          Vision:Within last year and Dental: No current dental problems and Receives regular dental care  Patient Active Problem List   Diagnosis Date Noted   Raynaud disease 11/11/2022   Bee sting  allergy 02/11/2022   Vegan 02/11/2022   Dizziness 12/22/2020   Hypotension 04/24/2018   Atypical nevi 07/27/2017   IBS (irritable bowel syndrome) 07/27/2017   Subclinical hypothyroidism 07/27/2017   Fibromyalgia 07/26/2017   Past Medical History:  Diagnosis Date   Allergy Birth   Anxiety    Asthma    Allergy related   Bacterial infection due to H. pylori 10/26/2022   Diagnosed on this date via stool test     Benign joint hypermobility    Depression    Ehlers-Danlos syndrome    Endometriosis    Fibromyalgia    GERD (gastroesophageal reflux disease)    IBS (irritable bowel syndrome)    Thyroid disease    History reviewed. No pertinent surgical history. Social History   Tobacco Use   Smoking status: Never   Smokeless tobacco: Never  Vaping Use   Vaping status: Never Used  Substance Use Topics   Alcohol use: Yes    Alcohol/week: 2.0 standard drinks of alcohol    Types: 1 Glasses of wine, 1 Shots of liquor per week    Comment: 0-2 drinks per week, many weeks none   Drug use: Not Currently      Patient Care Team: Avanell Shackleton, NP-C as PCP - General (Family Medicine) Rollene Rotunda,  MD as PCP - Cardiology (Cardiology) Silverio Lay, MD as Consulting Physician (Obstetrics and Gynecology)   Outpatient Medications Prior to Visit  Medication Sig   albuterol (VENTOLIN HFA) 108 (90 Base) MCG/ACT inhaler Inhale 1-2 puffs into the lungs every 4-6 hours as needed for cough/wheeze   bismuth subsalicylate (PEPTO BISMOL) 262 MG/15ML suspension Take 30 mLs by mouth as needed.   carboxymethylcellul-glycerin (REFRESH OPTIVE) 0.5-0.9 % ophthalmic solution Place 1 drop into both eyes 2 (two) times daily.   cetirizine (ZYRTEC) 10 MG tablet Take 10 mg by mouth daily.   cholecalciferol (VITAMIN D) 1000 units tablet Take 2,000 Units by mouth daily.   etonogestrel-ethinyl estradiol (NUVARING) 0.12-0.015 MG/24HR vaginal ring Place 1 ring vaginally every 3 weeks   etonogestrel-ethinyl  estradiol (NUVARING) 0.12-0.015 MG/24HR vaginal ring Place 1 ring vaginally every 3 weeks   famotidine (PEPCID) 20 MG tablet Take 20 mg by mouth as needed for heartburn or indigestion.   loperamide (IMODIUM A-D) 2 MG tablet    Methylcobalamin (B12-ACTIVE PO) Take 2,500 mcg by mouth 2 (two) times a week.   mometasone (ELOCON) 0.1 % cream Apply to affected area twice daily   NUVARING 0.12-0.015 MG/24HR vaginal ring Place 1 each vaginally every 28 (twenty-eight) days.    Spacer/Aero-Holding Chambers (AEROCHAMBER HOLDING CHAMBER) DEVI use as directed   [DISCONTINUED] Continuous Glucose Sensor (FREESTYLE LIBRE 3 SENSOR) MISC Apply 1 sensor to back of arm every 14 (fourteen) days.   [DISCONTINUED] MOLYBDENUM PO    [DISCONTINUED] N-ACETYL CYSTEINE PO    [DISCONTINUED] NON FORMULARY Take 5-10 mg by mouth as needed (for anxiety). CBD Oil ([name])   [DISCONTINUED] Quercetin 250 MG TABS    Facility-Administered Medications Prior to Visit  Medication Dose Route Frequency Provider   0.9 %  sodium chloride infusion  500 mL Intravenous Once Tressia Danas, MD    Review of Systems  Constitutional:  Negative for chills, fever and malaise/fatigue.  HENT:  Negative for congestion and sore throat.   Eyes:  Negative for blurred vision, double vision and photophobia.  Respiratory:  Negative for shortness of breath.   Cardiovascular:  Negative for chest pain, palpitations and leg swelling.  Gastrointestinal:  Negative for abdominal pain, constipation, diarrhea, nausea and vomiting.  Genitourinary:  Negative for dysuria, frequency and urgency.  Neurological:  Negative for dizziness, tingling, focal weakness and headaches.  Psychiatric/Behavioral:  Negative for depression and substance abuse. The patient is not nervous/anxious.        Objective:    BP 112/78 (BP Location: Left Arm, Patient Position: Sitting)   Pulse 81   Temp 97.6 F (36.4 C) (Temporal)   Ht 5\' 9"  (1.753 m)   Wt 149 lb (67.6  kg)   SpO2 99%   BMI 22.00 kg/m  BP Readings from Last 3 Encounters:  07/13/23 112/78  11/11/22 108/66  03/14/22 96/76   Wt Readings from Last 3 Encounters:  07/13/23 149 lb (67.6 kg)  11/11/22 151 lb (68.5 kg)  03/14/22 151 lb (68.5 kg)    Physical Exam Constitutional:      General: She is not in acute distress.    Appearance: She is not ill-appearing.  HENT:     Right Ear: Tympanic membrane, ear canal and external ear normal.     Left Ear: Tympanic membrane, ear canal and external ear normal.     Nose: Nose normal.     Mouth/Throat:     Mouth: Mucous membranes are moist.     Pharynx: Oropharynx is clear.  Eyes:     Extraocular Movements: Extraocular movements intact.     Conjunctiva/sclera: Conjunctivae normal.     Pupils: Pupils are equal, round, and reactive to light.  Neck:     Thyroid: No thyroid mass, thyromegaly or thyroid tenderness.  Cardiovascular:     Rate and Rhythm: Normal rate and regular rhythm.     Pulses: Normal pulses.     Heart sounds: Normal heart sounds.  Pulmonary:     Effort: Pulmonary effort is normal.     Breath sounds: Normal breath sounds.  Abdominal:     General: Bowel sounds are normal.     Palpations: Abdomen is soft.     Tenderness: There is no abdominal tenderness. There is no right CVA tenderness, left CVA tenderness, guarding or rebound.  Musculoskeletal:        General: Normal range of motion.     Cervical back: Normal range of motion and neck supple. No tenderness.     Right lower leg: No edema.     Left lower leg: No edema.  Lymphadenopathy:     Cervical: No cervical adenopathy.  Skin:    General: Skin is warm and dry.     Findings: No lesion or rash.  Neurological:     General: No focal deficit present.     Mental Status: She is alert and oriented to person, place, and time.     Cranial Nerves: No cranial nerve deficit.     Sensory: No sensory deficit.     Motor: No weakness.     Gait: Gait normal.  Psychiatric:         Mood and Affect: Mood normal.        Behavior: Behavior normal.        Thought Content: Thought content normal.      No results found for any visits on 07/13/23.    Assessment & Plan:    Routine Health Maintenance and Physical Exam  Problem List Items Addressed This Visit     Subclinical hypothyroidism   Relevant Orders   TSH   T4, free   Other Visit Diagnoses       Encounter for general adult medical examination with abnormal findings    -  Primary     Environmental allergies         Elevated blood sugar       Relevant Orders   Hemoglobin A1c     Vegan diet       Relevant Orders   Ferritin   Folate   Vitamin B12     Encounter for screening for other viral diseases       Relevant Orders   Hepatitis C antibody   HIV Antibody (routine testing w rflx)     Screening for hyperlipidemia       Relevant Orders   Lipid panel     Hypoglycemia       Relevant Orders   CBC with Differential/Platelet   Comprehensive metabolic panel   Hemoglobin A1c      Preventive health care reviewed.  Counseling on healthy lifestyle including diet and exercise.  Recommend regular dental and eye exams.  Immunizations reviewed.  Discussed safety. She is working with a functional medicine doctor.  Her IBS symptoms have improved. Up-to-date with OB/GYN visit and Pap smear.  Recent mammogram and breast ultrasound, benign She has seen an allergist and has possible asthma.  She will use albuterol inhaler preworkout to see if this makes a difference. Recent  low and high blood sugars when wearing a CGM.  Screen with A1c.  Recommend eating more protein to stabilize blood sugar.  Follow-up if she decides to wear another CGM and if this continues to be an issue. Follow-up pending lab results.  Return for pending labs.     Hetty Blend, NP-C

## 2023-07-14 LAB — HEPATITIS C ANTIBODY: Hepatitis C Ab: NONREACTIVE

## 2023-07-14 LAB — HIV ANTIBODY (ROUTINE TESTING W REFLEX): HIV 1&2 Ab, 4th Generation: NONREACTIVE

## 2023-07-16 ENCOUNTER — Encounter: Payer: Self-pay | Admitting: Family Medicine

## 2023-07-20 ENCOUNTER — Other Ambulatory Visit: Payer: Self-pay

## 2023-07-21 ENCOUNTER — Other Ambulatory Visit (HOSPITAL_COMMUNITY): Payer: Self-pay

## 2023-07-28 ENCOUNTER — Other Ambulatory Visit (HOSPITAL_COMMUNITY): Payer: Self-pay

## 2023-07-28 ENCOUNTER — Other Ambulatory Visit: Payer: Self-pay

## 2023-08-01 ENCOUNTER — Other Ambulatory Visit (HOSPITAL_COMMUNITY): Payer: Self-pay

## 2023-08-12 ENCOUNTER — Encounter: Payer: Self-pay | Admitting: Family Medicine

## 2023-08-12 DIAGNOSIS — Z91030 Bee allergy status: Secondary | ICD-10-CM

## 2023-08-14 MED ORDER — EPINEPHRINE 0.3 MG/0.3ML IJ SOAJ: 0.3000 mg | INTRAMUSCULAR | 0 refills | Status: AC | PRN

## 2023-08-23 ENCOUNTER — Ambulatory Visit (HOSPITAL_COMMUNITY)
Admission: RE | Admit: 2023-08-23 | Discharge: 2023-08-23 | Disposition: A | Discharge status: Home / Self Care | Source: Ambulatory Visit | Attending: Nurse Practitioner | Admitting: Nurse Practitioner

## 2023-08-23 ENCOUNTER — Encounter (HOSPITAL_COMMUNITY): Payer: Self-pay

## 2023-08-23 ENCOUNTER — Ambulatory Visit (INDEPENDENT_AMBULATORY_CARE_PROVIDER_SITE_OTHER)

## 2023-08-23 VITALS — BP 127/81 | HR 78 | Temp 97.7°F | Resp 16

## 2023-08-23 DIAGNOSIS — M898X8 Other specified disorders of bone, other site: Secondary | ICD-10-CM | POA: Diagnosis not present

## 2023-08-23 DIAGNOSIS — M79642 Pain in left hand: Secondary | ICD-10-CM

## 2023-08-23 NOTE — ED Triage Notes (Signed)
 Patient presents to the office for left hand pain x 3 days. Patient states she was playing golf over the weekend.  Pain 03/10

## 2023-08-23 NOTE — Discharge Instructions (Addendum)
 Thank you for allowing me to take care of you! As we discussed, awaiting final radiology interpretation of your x-ray.  In the meantime, splint as needed.  Tylenol, Motrin, ice, and elevation to help manage discomfort.  Follow up with orthopedics if symptoms persist beyond 7-10 days.

## 2023-08-23 NOTE — ED Provider Notes (Signed)
 [facility]    CSN: [id number] Arrival date & time: 08/23/23  1850      History   Chief Complaint Chief Complaint  Patient presents with   Hand Injury    HPI Sharon Taylor is a 43 y.o. female.   Patient is here requesting evaluation of left hand pain that started three days ago.  Reports that she went golfing at [employer] three days ago - noted some pain start as she was playing.  The discomfort has persisted since then.  It noted to the outside of her hand and worsens with palpation and movement.  She took some Tylenol for the discomfort earlier today.  She is right hand dominant.  No other injuries/concerns reported.    The history is provided by the patient.  Hand Injury   Past Medical History:  Diagnosis Date   Allergy Birth   Anxiety    Asthma    Allergy related   Bacterial infection due to H. pylori 10/26/2022   Diagnosed on this date via stool test     Benign joint hypermobility    Depression    Ehlers-Danlos syndrome    Endometriosis    Fibromyalgia    GERD (gastroesophageal reflux disease)    IBS (irritable bowel syndrome)    Thyroid disease     Patient Active Problem List   Diagnosis Date Noted   Raynaud disease 11/11/2022   Bee sting allergy 02/11/2022   Vegan 02/11/2022   Dizziness 12/22/2020   Hypotension 04/24/2018   Atypical nevi 07/27/2017   IBS (irritable bowel syndrome) 07/27/2017   Subclinical hypothyroidism 07/27/2017   Fibromyalgia 07/26/2017    History reviewed. No pertinent surgical history.  OB History   No obstetric history on file.      Home Medications    Prior to Admission medications   Medication Sig Start Date End Date Taking? Authorizing Provider  albuterol (VENTOLIN HFA) 108 (90 Base) MCG/ACT inhaler Inhale 1-2 puffs into the lungs every 4-6 hours as needed for cough/wheeze 05/12/23     bismuth subsalicylate (PEPTO BISMOL) 262 MG/15ML suspension Take 30 mLs by mouth as needed.    [provider]  carboxymethylcellul-glycerin (REFRESH OPTIVE) 0.5-0.9 % ophthalmic solution Place 1 drop into both eyes 2 (two) times daily.    [provider]  cetirizine (ZYRTEC) 10 MG tablet Take 10 mg by mouth daily.    [provider]  cholecalciferol (VITAMIN D) 1000 units tablet Take 2,000 Units by mouth daily.    [provider]  EPINEPHrine 0.3 mg/0.3 mL IJ SOAJ injection Inject 0.3 mg into the muscle as needed for anaphylaxis. 08/14/23   Avanell Shackleton, NP-C  etonogestrel-ethinyl estradiol (NUVARING) 0.12-0.015 MG/24HR vaginal ring Place 1 ring vaginally every 3 weeks 12/13/22     etonogestrel-ethinyl estradiol (NUVARING) 0.12-0.015 MG/24HR vaginal ring Place 1 ring vaginally every 3 weeks 02/28/23     famotidine (PEPCID) 20 MG tablet Take 20 mg by mouth as needed for heartburn or indigestion.    [provider]  loperamide (IMODIUM A-D) 2 MG tablet     [provider]  Methylcobalamin (B12-ACTIVE PO) Take 2,500 mcg by mouth 2 (two) times a week.    [provider]  mometasone (ELOCON) 0.1 % cream Apply to affected area twice daily 03/01/23     NUVARING 0.12-0.015 MG/24HR vaginal ring Place 1 each vaginally every 28 (twenty-eight) days.  09/24/17   [provider]  Spacer/Aero-Holding Chambers (AEROCHAMBER HOLDING CHAMBER) DEVI use as  directed 03/01/23   Sidney Ace, MD    Family History Family History  Problem Relation Age of Onset   COPD Mother    Heart attack Father 62   Depression Father    Hyperlipidemia Father    ADD / ADHD Sister    Alcohol abuse Sister    Heart disease Maternal Grandfather    Heart attack Maternal Grandfather    Alcohol abuse Maternal Grandfather    Hypertension Maternal Grandfather    Hypertension Paternal Grandfather    Ovarian cancer Paternal Aunt    Alcohol abuse Maternal Grandmother    Alcohol abuse Maternal Uncle    Alcohol abuse Maternal Uncle    Colon cancer Neg Hx    Stomach  cancer Neg Hx    Esophageal cancer Neg Hx    Pancreatic cancer Neg Hx     Social History Social History   Tobacco Use   Smoking status: Never   Smokeless tobacco: Never  Vaping Use   Vaping status: Never Used  Substance Use Topics   Alcohol use: Yes    Alcohol/week: 2.0 standard drinks of alcohol    Types: 1 Glasses of wine, 1 Shots of liquor per week    Comment: 0-2 drinks per week, many weeks none   Drug use: Not Currently     Allergies   Patient has no known allergies.   Review of Systems Review of Systems  Constitutional: Negative.   HENT: Negative.    Eyes: Negative.   Respiratory: Negative.    Cardiovascular: Negative.   Gastrointestinal: Negative.   Musculoskeletal:  Positive for arthralgias (Left Hand).  Skin: Negative.   Neurological: Negative.   Psychiatric/Behavioral: Negative.       Physical Exam Triage Vital Signs ED Triage Vitals  Encounter Vitals Group     BP 08/23/23 1904 127/81     Systolic BP Percentile --      Diastolic BP Percentile --      Pulse Rate 08/23/23 1904 78     Resp 08/23/23 1904 16     Temp 08/23/23 1904 97.7 F (36.5 C)     Temp Source 08/23/23 1904 Oral     SpO2 08/23/23 1904 100 %     Weight --      Height --      Head Circumference --      Peak Flow --      Pain Score 08/23/23 1906 4     Pain Loc --      Pain Education --      Exclude from Growth Chart --    No data found.  Updated Vital Signs BP 127/81 (BP Location: Left Arm)   Pulse 78   Temp 97.7 F (36.5 C) (Oral)   Resp 16   LMP  (Approximate)   SpO2 100%   Visual Acuity Right Eye Distance:   Left Eye Distance:   Bilateral Distance:    Right Eye Near:   Left Eye Near:    Bilateral Near:     Physical Exam Vitals and nursing note reviewed.  Constitutional:      Appearance: Normal appearance.  Cardiovascular:     Rate and Rhythm: Normal rate and regular rhythm.     Heart sounds: Normal heart sounds.  Pulmonary:     Effort: Pulmonary effort  is normal.     Breath sounds: Normal breath sounds.  Musculoskeletal:     Left hand: Swelling, tenderness and bony tenderness present. Normal range of motion. Normal pulse.  Comments: Discomfort over the thenar eminence and diffuse medial/ulnar side of the hand.  No ecchymosis noted.  No snuff box tenderness.  Minimal soft tissue swelling noted.  Neurological:     General: No focal deficit present.     Mental Status: She is alert and oriented to person, place, and time.  Psychiatric:        Mood and Affect: Mood normal.        Behavior: Behavior normal.        Thought Content: Thought content normal.        Judgment: Judgment normal.    UC Treatments / Results  Labs (all labs ordered are listed, but only abnormal results are displayed) Labs Reviewed - No data to display  EKG  Radiology No results found.  Procedures Procedures (including critical care time)  Medications Ordered in UC Medications - No data to display  Initial Impression / Assessment and Plan / UC Course  I have reviewed the triage vital signs and the nursing notes.  Pertinent labs & imaging results that were available during my care of the patient were reviewed by me and considered in my medical decision making (see chart for details).     Patient presents requesting evaluation for the new onset of left hand pain after playing golf.  Discomfort over the fifth metacarpal/hypothenar eminence.  Upon initial review of the x-ray, possible anomaly noted to the mid fifth metacarpal shaft but not clearly defined.  Awaiting final radiologist interpretation.  Due to discomfort and patient preference, will apply a velcro ulnar gutter splint.  Continue OTC medications such as Tylenol/Motrin as needed for pain.  May elevated and ice as well.  Follow up with ortho if symptoms persist/worsen over the course of 7-10 days.  Final Clinical Impressions(s) / UC Diagnoses   Final diagnoses:  Left hand pain   Discharge  Instructions   None    ED Prescriptions   None    PDMP not reviewed this encounter.   Burgess Estelle, FNP 08/23/23 [facility]

## 2023-08-29 ENCOUNTER — Ambulatory Visit (HOSPITAL_BASED_OUTPATIENT_CLINIC_OR_DEPARTMENT_OTHER): Admitting: Student

## 2023-10-03 ENCOUNTER — Other Ambulatory Visit (HOSPITAL_COMMUNITY): Payer: Self-pay

## 2023-10-03 MED ORDER — NEXTSTELLIS 3-14.2 MG PO TABS
1.0000 | ORAL_TABLET | Freq: Every day | ORAL | 1 refills | Status: DC
  Filled 2023-10-03: qty 84, 84d supply, fill #0
  Filled 2024-01-31: qty 84, 84d supply, fill #1

## 2023-10-04 ENCOUNTER — Other Ambulatory Visit (HOSPITAL_COMMUNITY): Payer: Self-pay

## 2023-10-17 DIAGNOSIS — L821 Other seborrheic keratosis: Secondary | ICD-10-CM | POA: Diagnosis not present

## 2023-10-17 DIAGNOSIS — L814 Other melanin hyperpigmentation: Secondary | ICD-10-CM | POA: Diagnosis not present

## 2023-10-17 DIAGNOSIS — D229 Melanocytic nevi, unspecified: Secondary | ICD-10-CM | POA: Diagnosis not present

## 2023-10-17 DIAGNOSIS — D1801 Hemangioma of skin and subcutaneous tissue: Secondary | ICD-10-CM | POA: Diagnosis not present

## 2023-10-17 DIAGNOSIS — L578 Other skin changes due to chronic exposure to nonionizing radiation: Secondary | ICD-10-CM | POA: Diagnosis not present

## 2023-11-01 ENCOUNTER — Other Ambulatory Visit (HOSPITAL_COMMUNITY): Payer: Self-pay

## 2023-11-24 ENCOUNTER — Ambulatory Visit (HOSPITAL_BASED_OUTPATIENT_CLINIC_OR_DEPARTMENT_OTHER): Admitting: Student

## 2023-11-24 ENCOUNTER — Ambulatory Visit (HOSPITAL_BASED_OUTPATIENT_CLINIC_OR_DEPARTMENT_OTHER)

## 2023-11-24 ENCOUNTER — Encounter (HOSPITAL_BASED_OUTPATIENT_CLINIC_OR_DEPARTMENT_OTHER): Payer: Self-pay | Admitting: Student

## 2023-11-24 DIAGNOSIS — M25511 Pain in right shoulder: Secondary | ICD-10-CM

## 2023-11-24 NOTE — Progress Notes (Signed)
 Chief Complaint: Right shoulder pain    Discussed the use of AI scribe software for clinical note transcription with the patient, who gave verbal consent to proceed.  History of Present Illness Sharon Taylor is a 43 year old female with history of hypermobility who presents with right shoulder pain.  She experiences significant right shoulder pain for the past few days, primarily on the lateral aspect, with an intensity of 8 to 9 out of 10. The pain is most severe in the morning and at night, especially during activities such as dressing or reaching. It is exacerbated by reaching or rotating the shoulder.  There is mild tingling in the hand without significant numbness. No pain radiates to the neck or elbow. She denies any previous shoulder injuries or dislocations but reports some popping and a crunchy feeling in the shoulder.  Her history of hypermobility is noted, and her brother has a history of shoulder dislocations. She has not experienced any dislocations herself. She denies any major trauma or previous similar episodes and has not noticed any significant weakness.   Surgical History:   None  PMH/PSH/Family History/Social History/Meds/Allergies:    Past Medical History:  Diagnosis Date   Allergy Birth   Anxiety    Asthma    Allergy related   Bacterial infection due to H. pylori 10/26/2022   Diagnosed on this date via stool test     Benign joint hypermobility    Depression    Ehlers-Danlos syndrome    Endometriosis    Fibromyalgia    GERD (gastroesophageal reflux disease)    IBS (irritable bowel syndrome)    Thyroid disease    History reviewed. No pertinent surgical history. Social History   Socioeconomic History   Marital status: Married    Spouse name: Not on file   Number of children: Not on file   Years of education: Not on file   Highest education level: Master's degree (e.g., MA, MS, MEng, MEd, MSW, MBA)   Occupational History   Not on file  Tobacco Use   Smoking status: Never   Smokeless tobacco: Never  Vaping Use   Vaping status: Never Used  Substance and Sexual Activity   Alcohol use: Yes    Alcohol/week: 2.0 standard drinks of alcohol    Types: 1 Glasses of wine, 1 Shots of liquor per week    Comment: 0-2 drinks per week, many weeks none   Drug use: Not Currently   Sexual activity: Yes    Comment: Same sex relationship  Other Topics Concern   Not on file  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    Social Drivers of Corporate investment banker Strain: Low Risk  (07/12/2023)   Overall Financial Resource Strain (CARDIA)    Difficulty of Paying Living Expenses: Not very hard  Food Insecurity: No Food Insecurity (07/12/2023)   Hunger Vital Sign    Worried About Running Out of Food in the Last Year: Never true    Ran Out of Food in the Last Year: Never true  Transportation Needs: No Transportation Needs (07/12/2023)   PRAPARE - Administrator, Civil Service (Medical): No    Lack of Transportation (Non-Medical): No  Physical Activity: Insufficiently Active (07/12/2023)   Exercise Vital Sign    Days of Exercise per Week:  3 days    Minutes of Exercise per Session: 30 min  Stress: No Stress Concern Present (07/12/2023)   [employer] of Occupational Health - Occupational Stress Questionnaire    Feeling of Stress : Only a little  Social Connections: Moderately Isolated (07/12/2023)   Social Connection and Isolation Panel    Frequency of Communication with Friends and Family: Twice a week    Frequency of Social Gatherings with Friends and Family: Once a week    Attends Religious Services: Never    Database administrator or Organizations: No    Attends Engineer, structural: Not on file    Marital Status: Married   Family History  Problem Relation Age of Onset   COPD Mother    Heart attack Father 73   Depression Father    Hyperlipidemia Father    ADD  / ADHD Sister    Alcohol abuse Sister    Heart disease Maternal Grandfather    Heart attack Maternal Grandfather    Alcohol abuse Maternal Grandfather    Hypertension Maternal Grandfather    Hypertension Paternal Grandfather    Ovarian cancer Paternal Aunt    Alcohol abuse Maternal Grandmother    Alcohol abuse Maternal Uncle    Alcohol abuse Maternal Uncle    Colon cancer Neg Hx    Stomach cancer Neg Hx    Esophageal cancer Neg Hx    Pancreatic cancer Neg Hx    No Known Allergies Current Outpatient Medications  Medication Sig Dispense Refill   albuterol  (VENTOLIN  HFA) 108 (90 Base) MCG/ACT inhaler Inhale 1-2 puffs into the lungs every 4-6 hours as needed for cough/wheeze 6.7 g 0   bismuth subsalicylate (PEPTO BISMOL) 262 MG/15ML suspension Take 30 mLs by mouth as needed.     carboxymethylcellul-glycerin (REFRESH OPTIVE) 0.5-0.9 % ophthalmic solution Place 1 drop into both eyes 2 (two) times daily.     cetirizine (ZYRTEC) 10 MG tablet Take 10 mg by mouth daily.     cholecalciferol (VITAMIN D) 1000 units tablet Take 2,000 Units by mouth daily.     Drospirenone -Estetrol  (NEXTSTELLIS ) 3-14.2 MG TABS Take 1 tablet by mouth daily. 84 tablet 1   EPINEPHrine  0.3 mg/0.3 mL IJ SOAJ injection Inject 0.3 mg into the muscle as needed for anaphylaxis. 1 each 0   etonogestrel -ethinyl estradiol  (NUVARING) 0.12-0.015 MG/24HR vaginal ring Place 1 ring vaginally every 3 weeks 3 each 2   etonogestrel -ethinyl estradiol  (NUVARING) 0.12-0.015 MG/24HR vaginal ring Place 1 ring vaginally every 3 weeks 3 each 2   famotidine (PEPCID) 20 MG tablet Take 20 mg by mouth as needed for heartburn or indigestion.     loperamide (IMODIUM A-D) 2 MG tablet      Methylcobalamin (B12-ACTIVE PO) Take 2,500 mcg by mouth 2 (two) times a week.     mometasone  (ELOCON ) 0.1 % cream Apply to affected area twice daily 100 g 1   NUVARING 0.12-0.015 MG/24HR vaginal ring Place 1 each vaginally every 28 (twenty-eight) days.   1    Spacer/Aero-Holding Chambers (AEROCHAMBER HOLDING CHAMBER) DEVI use as directed 1 each 0   Current Facility-Administered Medications  Medication Dose Route Frequency Provider Last Rate Last Admin   0.9 %  sodium chloride  infusion  500 mL Intravenous Once Beavers, Kimberly, MD       No results found.  Review of Systems:   A ROS was performed including pertinent positives and negatives as documented in the HPI.  Physical Exam :   Constitutional: NAD and appears  stated age Neurological: Alert and oriented Psych: Appropriate affect and cooperative There were no vitals taken for this visit.   Comprehensive Musculoskeletal Exam:    No significant tenderness in the right shoulder over the glenohumeral joint or lateral deltoid.  Active range of motion of bilateral shoulders is to 170 degrees flexion, 80 degrees external rotation, and internal rotation to T12.  Negative empty can, Neer, and Hawkins.  Pain with O'Brien.  No tenderness in the cervical spine with full cervical range of motion.  Grip strength 5/5 bilaterally.  Imaging:   Xray (right shoulder 3 views): Negative for bony abnormality   I personally reviewed and interpreted the radiographs.      Assessment & Plan Right shoulder pain   She experiences acute pain rated 8-9/10 during certain movements, particularly in the morning and at night. Differential diagnosis includes early-stage frozen shoulder, rotator cuff pathology, and labral involvement due to hypermobility. X-rays look really good. Schedule a steroid injection with ultrasound guidance for Monday. Reassess in two weeks to evaluate response and consider MRI or physical therapy if needed.       I personally saw and evaluated the patient, and participated in the management and treatment plan.  Leonce Reveal, PA-C Orthopedics

## 2023-11-27 ENCOUNTER — Encounter (HOSPITAL_BASED_OUTPATIENT_CLINIC_OR_DEPARTMENT_OTHER): Payer: Self-pay

## 2023-11-27 ENCOUNTER — Ambulatory Visit (HOSPITAL_BASED_OUTPATIENT_CLINIC_OR_DEPARTMENT_OTHER): Admitting: Student

## 2023-11-29 ENCOUNTER — Other Ambulatory Visit (HOSPITAL_BASED_OUTPATIENT_CLINIC_OR_DEPARTMENT_OTHER): Payer: Self-pay

## 2023-11-29 ENCOUNTER — Other Ambulatory Visit (HOSPITAL_COMMUNITY): Payer: Self-pay

## 2023-11-29 MED ORDER — PROGESTERONE MICRONIZED 100 MG PO CAPS
100.0000 mg | ORAL_CAPSULE | Freq: Every evening | ORAL | 1 refills | Status: AC
  Filled 2023-11-29: qty 90, 45d supply, fill #0

## 2023-11-29 MED ORDER — NEXTSTELLIS 3-14.2 MG PO TABS
ORAL_TABLET | ORAL | 1 refills | Status: DC
  Filled 2023-11-29: qty 84, 63d supply, fill #0
  Filled 2024-03-31: qty 84, 63d supply, fill #1

## 2023-11-30 ENCOUNTER — Ambulatory Visit (HOSPITAL_BASED_OUTPATIENT_CLINIC_OR_DEPARTMENT_OTHER): Admitting: Student

## 2023-12-11 ENCOUNTER — Other Ambulatory Visit (HOSPITAL_COMMUNITY): Payer: Self-pay

## 2023-12-12 ENCOUNTER — Other Ambulatory Visit: Payer: Self-pay | Admitting: Obstetrics and Gynecology

## 2023-12-12 DIAGNOSIS — R928 Other abnormal and inconclusive findings on diagnostic imaging of breast: Secondary | ICD-10-CM

## 2023-12-27 DIAGNOSIS — R41840 Attention and concentration deficit: Secondary | ICD-10-CM | POA: Diagnosis not present

## 2024-01-05 ENCOUNTER — Other Ambulatory Visit (HOSPITAL_COMMUNITY): Payer: Self-pay

## 2024-01-05 DIAGNOSIS — Z79899 Other long term (current) drug therapy: Secondary | ICD-10-CM | POA: Diagnosis not present

## 2024-01-05 DIAGNOSIS — F419 Anxiety disorder, unspecified: Secondary | ICD-10-CM | POA: Diagnosis not present

## 2024-01-05 DIAGNOSIS — F900 Attention-deficit hyperactivity disorder, predominantly inattentive type: Secondary | ICD-10-CM | POA: Diagnosis not present

## 2024-01-05 DIAGNOSIS — R41840 Attention and concentration deficit: Secondary | ICD-10-CM | POA: Diagnosis not present

## 2024-01-05 MED ORDER — LISDEXAMFETAMINE DIMESYLATE 10 MG PO CAPS
10.0000 mg | ORAL_CAPSULE | Freq: Every day | ORAL | 0 refills | Status: DC
  Filled 2024-01-05: qty 9, 9d supply, fill #0
  Filled 2024-01-05: qty 21, 21d supply, fill #0

## 2024-01-26 ENCOUNTER — Inpatient Hospital Stay
Admission: RE | Admit: 2024-01-26 | Discharge: 2024-01-26 | Disposition: A | Discharge status: Home / Self Care | Source: Ambulatory Visit | Attending: Obstetrics and Gynecology | Admitting: Obstetrics and Gynecology

## 2024-01-26 DIAGNOSIS — R928 Other abnormal and inconclusive findings on diagnostic imaging of breast: Secondary | ICD-10-CM

## 2024-01-26 DIAGNOSIS — Z1239 Encounter for other screening for malignant neoplasm of breast: Secondary | ICD-10-CM | POA: Diagnosis not present

## 2024-01-26 MED ORDER — GADOPICLENOL 0.5 MMOL/ML IV SOLN
6.0000 mL | Freq: Once | INTRAVENOUS | Status: AC | PRN
  Administered 2024-01-26: 6 mL via INTRAVENOUS

## 2024-01-31 ENCOUNTER — Other Ambulatory Visit (HOSPITAL_COMMUNITY): Payer: Self-pay

## 2024-01-31 MED ORDER — LISDEXAMFETAMINE DIMESYLATE 10 MG PO CAPS
10.0000 mg | ORAL_CAPSULE | Freq: Every day | ORAL | 0 refills | Status: DC
  Filled 2024-02-02: qty 30, 30d supply, fill #0

## 2024-02-02 ENCOUNTER — Other Ambulatory Visit (HOSPITAL_COMMUNITY): Payer: Self-pay

## 2024-02-02 DIAGNOSIS — F900 Attention-deficit hyperactivity disorder, predominantly inattentive type: Secondary | ICD-10-CM | POA: Diagnosis not present

## 2024-02-16 DIAGNOSIS — F902 Attention-deficit hyperactivity disorder, combined type: Secondary | ICD-10-CM | POA: Diagnosis not present

## 2024-02-16 DIAGNOSIS — F900 Attention-deficit hyperactivity disorder, predominantly inattentive type: Secondary | ICD-10-CM | POA: Diagnosis not present

## 2024-02-16 DIAGNOSIS — Z79899 Other long term (current) drug therapy: Secondary | ICD-10-CM | POA: Diagnosis not present

## 2024-02-21 ENCOUNTER — Ambulatory Visit: Admitting: Family Medicine

## 2024-03-01 ENCOUNTER — Other Ambulatory Visit (HOSPITAL_COMMUNITY): Payer: Self-pay

## 2024-03-01 ENCOUNTER — Other Ambulatory Visit: Payer: Self-pay

## 2024-03-01 DIAGNOSIS — H524 Presbyopia: Secondary | ICD-10-CM | POA: Diagnosis not present

## 2024-03-01 DIAGNOSIS — H5213 Myopia, bilateral: Secondary | ICD-10-CM | POA: Diagnosis not present

## 2024-03-01 DIAGNOSIS — H52223 Regular astigmatism, bilateral: Secondary | ICD-10-CM | POA: Diagnosis not present

## 2024-03-01 MED ORDER — LISDEXAMFETAMINE DIMESYLATE 10 MG PO CAPS
10.0000 mg | ORAL_CAPSULE | Freq: Every day | ORAL | 0 refills | Status: DC
  Filled 2024-03-01: qty 30, 30d supply, fill #0

## 2024-03-18 ENCOUNTER — Encounter: Payer: Self-pay | Admitting: Radiology

## 2024-03-31 ENCOUNTER — Other Ambulatory Visit (HOSPITAL_COMMUNITY): Payer: Self-pay

## 2024-04-01 ENCOUNTER — Other Ambulatory Visit: Payer: Self-pay

## 2024-04-01 ENCOUNTER — Other Ambulatory Visit (HOSPITAL_COMMUNITY): Payer: Self-pay

## 2024-04-01 MED ORDER — LISDEXAMFETAMINE DIMESYLATE 10 MG PO CAPS
10.0000 mg | ORAL_CAPSULE | Freq: Every day | ORAL | 0 refills | Status: AC
  Filled 2024-04-01: qty 30, 30d supply, fill #0

## 2024-04-01 MED ORDER — LISDEXAMFETAMINE DIMESYLATE 10 MG PO CAPS
10.0000 mg | ORAL_CAPSULE | Freq: Every day | ORAL | 0 refills | Status: AC
  Filled 2024-05-02: qty 30, 30d supply, fill #0

## 2024-04-02 ENCOUNTER — Other Ambulatory Visit (HOSPITAL_COMMUNITY): Payer: Self-pay

## 2024-04-10 ENCOUNTER — Other Ambulatory Visit: Payer: Self-pay

## 2024-04-10 ENCOUNTER — Other Ambulatory Visit (HOSPITAL_COMMUNITY): Payer: Self-pay

## 2024-04-10 DIAGNOSIS — Z6821 Body mass index (BMI) 21.0-21.9, adult: Secondary | ICD-10-CM | POA: Diagnosis not present

## 2024-04-10 DIAGNOSIS — F3281 Premenstrual dysphoric disorder: Secondary | ICD-10-CM | POA: Diagnosis not present

## 2024-04-10 DIAGNOSIS — Z1330 Encounter for screening examination for mental health and behavioral disorders, unspecified: Secondary | ICD-10-CM | POA: Diagnosis not present

## 2024-04-10 DIAGNOSIS — Z01419 Encounter for gynecological examination (general) (routine) without abnormal findings: Secondary | ICD-10-CM | POA: Diagnosis not present

## 2024-04-10 DIAGNOSIS — Z124 Encounter for screening for malignant neoplasm of cervix: Secondary | ICD-10-CM | POA: Diagnosis not present

## 2024-04-10 DIAGNOSIS — Z1231 Encounter for screening mammogram for malignant neoplasm of breast: Secondary | ICD-10-CM | POA: Diagnosis not present

## 2024-04-10 MED ORDER — NEXTSTELLIS 3-14.2 MG PO TABS
1.0000 | ORAL_TABLET | Freq: Every day | ORAL | 1 refills | Status: AC
  Filled 2024-04-10: qty 84, 72d supply, fill #0

## 2024-05-02 ENCOUNTER — Other Ambulatory Visit (HOSPITAL_COMMUNITY): Payer: Self-pay

## 2024-05-28 ENCOUNTER — Other Ambulatory Visit (HOSPITAL_COMMUNITY): Payer: Self-pay

## 2024-05-28 ENCOUNTER — Other Ambulatory Visit: Payer: Self-pay

## 2024-05-28 MED ORDER — LISDEXAMFETAMINE DIMESYLATE 20 MG PO CHEW
20.0000 mg | CHEWABLE_TABLET | Freq: Every day | ORAL | 0 refills | Status: AC
  Filled 2024-05-28: qty 30, 30d supply, fill #0

## 2024-07-19 ENCOUNTER — Encounter: Admitting: Family Medicine
